# Patient Record
Sex: Female | Born: 1935 | Race: White | Hispanic: No | Marital: Married | Smoking: Never smoker
Health system: Southern US, Community
[De-identification: ages and names within clinical notes are randomized; demographics above are authoritative.]

## PROBLEM LIST (undated history)

## (undated) DIAGNOSIS — K5790 Diverticulosis of intestine, part unspecified, without perforation or abscess without bleeding: Secondary | ICD-10-CM

## (undated) DIAGNOSIS — K219 Gastro-esophageal reflux disease without esophagitis: Secondary | ICD-10-CM

## (undated) DIAGNOSIS — I639 Cerebral infarction, unspecified: Secondary | ICD-10-CM

## (undated) DIAGNOSIS — I6529 Occlusion and stenosis of unspecified carotid artery: Secondary | ICD-10-CM

## (undated) DIAGNOSIS — E785 Hyperlipidemia, unspecified: Secondary | ICD-10-CM

## (undated) HISTORY — DX: Cerebral infarction, unspecified: I63.9

## (undated) HISTORY — DX: Occlusion and stenosis of unspecified carotid artery: I65.29

## (undated) HISTORY — PX: TONSILLECTOMY: SUR1361

## (undated) HISTORY — DX: Gastro-esophageal reflux disease without esophagitis: K21.9

## (undated) HISTORY — DX: Hyperlipidemia, unspecified: E78.5

---

## 1998-02-13 ENCOUNTER — Other Ambulatory Visit: Admission: RE | Admit: 1998-02-13 | Discharge: 1998-02-13 | Payer: Self-pay | Admitting: Obstetrics and Gynecology

## 1999-05-19 ENCOUNTER — Other Ambulatory Visit: Admission: RE | Admit: 1999-05-19 | Discharge: 1999-05-19 | Payer: Self-pay | Admitting: Obstetrics and Gynecology

## 1999-09-06 ENCOUNTER — Encounter: Admission: RE | Admit: 1999-09-06 | Discharge: 1999-09-06 | Payer: Self-pay | Admitting: Obstetrics and Gynecology

## 1999-09-06 ENCOUNTER — Encounter: Payer: Self-pay | Admitting: Geriatric Medicine

## 2000-05-24 ENCOUNTER — Other Ambulatory Visit: Admission: RE | Admit: 2000-05-24 | Discharge: 2000-05-24 | Payer: Self-pay | Admitting: Geriatric Medicine

## 2000-10-11 ENCOUNTER — Encounter: Payer: Self-pay | Admitting: Obstetrics and Gynecology

## 2000-10-11 ENCOUNTER — Ambulatory Visit (HOSPITAL_COMMUNITY): Admission: RE | Admit: 2000-10-11 | Discharge: 2000-10-11 | Payer: Self-pay | Admitting: Obstetrics and Gynecology

## 2001-06-01 ENCOUNTER — Other Ambulatory Visit: Admission: RE | Admit: 2001-06-01 | Discharge: 2001-06-01 | Payer: Self-pay | Admitting: Obstetrics and Gynecology

## 2001-08-28 ENCOUNTER — Encounter: Payer: Self-pay | Admitting: Obstetrics and Gynecology

## 2001-08-28 ENCOUNTER — Encounter: Admission: RE | Admit: 2001-08-28 | Discharge: 2001-08-28 | Payer: Self-pay | Admitting: Obstetrics and Gynecology

## 2001-10-12 ENCOUNTER — Encounter: Payer: Self-pay | Admitting: Obstetrics and Gynecology

## 2001-10-12 ENCOUNTER — Encounter: Admission: RE | Admit: 2001-10-12 | Discharge: 2001-10-12 | Payer: Self-pay | Admitting: Obstetrics and Gynecology

## 2002-12-09 ENCOUNTER — Encounter: Admission: RE | Admit: 2002-12-09 | Discharge: 2002-12-09 | Payer: Self-pay | Admitting: Obstetrics and Gynecology

## 2002-12-09 ENCOUNTER — Encounter: Payer: Self-pay | Admitting: Obstetrics and Gynecology

## 2002-12-18 ENCOUNTER — Other Ambulatory Visit: Admission: RE | Admit: 2002-12-18 | Discharge: 2002-12-18 | Payer: Self-pay | Admitting: Gynecology

## 2002-12-22 ENCOUNTER — Emergency Department (HOSPITAL_COMMUNITY): Admission: EM | Admit: 2002-12-22 | Discharge: 2002-12-22 | Payer: Self-pay | Admitting: Emergency Medicine

## 2002-12-22 ENCOUNTER — Encounter: Payer: Self-pay | Admitting: Emergency Medicine

## 2003-12-18 ENCOUNTER — Encounter: Admission: RE | Admit: 2003-12-18 | Discharge: 2003-12-18 | Payer: Self-pay | Admitting: Obstetrics and Gynecology

## 2004-04-14 ENCOUNTER — Ambulatory Visit (HOSPITAL_COMMUNITY): Admission: RE | Admit: 2004-04-14 | Discharge: 2004-04-14 | Payer: Self-pay | Admitting: Geriatric Medicine

## 2004-04-20 HISTORY — PX: CAROTID ENDARTERECTOMY: SUR193

## 2004-05-18 ENCOUNTER — Encounter (INDEPENDENT_AMBULATORY_CARE_PROVIDER_SITE_OTHER): Payer: Self-pay | Admitting: Specialist

## 2004-05-18 ENCOUNTER — Ambulatory Visit (HOSPITAL_COMMUNITY): Admission: RE | Admit: 2004-05-18 | Discharge: 2004-05-19 | Payer: Self-pay | Admitting: Vascular Surgery

## 2004-12-24 ENCOUNTER — Encounter: Admission: RE | Admit: 2004-12-24 | Discharge: 2004-12-24 | Payer: Self-pay | Admitting: Geriatric Medicine

## 2005-04-15 ENCOUNTER — Other Ambulatory Visit: Admission: RE | Admit: 2005-04-15 | Discharge: 2005-04-15 | Payer: Self-pay | Admitting: Geriatric Medicine

## 2006-01-03 ENCOUNTER — Encounter: Admission: RE | Admit: 2006-01-03 | Discharge: 2006-01-03 | Payer: Self-pay | Admitting: Geriatric Medicine

## 2007-01-08 ENCOUNTER — Encounter: Admission: RE | Admit: 2007-01-08 | Discharge: 2007-01-08 | Payer: Self-pay | Admitting: Geriatric Medicine

## 2007-04-30 ENCOUNTER — Other Ambulatory Visit: Admission: RE | Admit: 2007-04-30 | Discharge: 2007-04-30 | Payer: Self-pay | Admitting: Urology

## 2007-06-18 ENCOUNTER — Ambulatory Visit: Payer: Self-pay | Admitting: Vascular Surgery

## 2007-11-30 ENCOUNTER — Ambulatory Visit: Payer: Self-pay | Admitting: Vascular Surgery

## 2008-01-28 ENCOUNTER — Encounter: Admission: RE | Admit: 2008-01-28 | Discharge: 2008-01-28 | Payer: Self-pay | Admitting: Geriatric Medicine

## 2008-06-03 ENCOUNTER — Ambulatory Visit: Payer: Self-pay | Admitting: Vascular Surgery

## 2008-12-17 ENCOUNTER — Ambulatory Visit: Payer: Self-pay | Admitting: Vascular Surgery

## 2009-02-03 ENCOUNTER — Encounter: Admission: RE | Admit: 2009-02-03 | Discharge: 2009-02-03 | Payer: Self-pay | Admitting: Geriatric Medicine

## 2009-05-06 ENCOUNTER — Other Ambulatory Visit: Admission: RE | Admit: 2009-05-06 | Discharge: 2009-05-06 | Payer: Self-pay | Admitting: Geriatric Medicine

## 2009-06-11 ENCOUNTER — Ambulatory Visit: Payer: Self-pay | Admitting: Surgery

## 2010-02-26 ENCOUNTER — Encounter: Admission: RE | Admit: 2010-02-26 | Discharge: 2010-02-26 | Payer: Self-pay | Admitting: Geriatric Medicine

## 2010-05-11 ENCOUNTER — Other Ambulatory Visit: Admission: RE | Admit: 2010-05-11 | Discharge: 2010-05-11 | Payer: Self-pay | Admitting: Geriatric Medicine

## 2010-06-01 ENCOUNTER — Ambulatory Visit: Payer: Self-pay | Admitting: Vascular Surgery

## 2010-11-02 NOTE — Procedures (Signed)
CAROTID DUPLEX EXAM   INDICATION:  Followup carotid artery disease.   HISTORY:  Diabetes:  No.  Cardiac:  No.  Hypertension:  No.  Smoking:  No.  Previous Surgery:  Left CEA with DPA, November, 2005 by Dr. Arbie Cookey.  CV History:  No.  Amaurosis Fugax No, Paresthesias No, Hemiparesis No                                       RIGHT             LEFT  Brachial systolic pressure:         160               158  Brachial Doppler waveforms:         Triphasic         Triphasic  Vertebral direction of flow:        Antegrade         Antegrade  DUPLEX VELOCITIES (cm/sec)  CCA peak systolic                   111               105  ECA peak systolic                   134               173  ICA peak systolic                   212               93  ICA end diastolic                   71                31  PLAQUE MORPHOLOGY:                  Calcified         N/A  PLAQUE AMOUNT:                      Moderate          N/A  PLAQUE LOCATION:                    ICA               N/A   IMPRESSION:  1. Right internal carotid artery shows evidence of 60-79% stenosis      (low end of range), which shows an increase in velocity; however,      no category change.  2. Left internal carotid artery, status post carotid endarterectomy,      shows no evidence of restenosis.  3. Left mild internal carotid artery stenosis.   ___________________________________________  Larina Earthly, M.D.   AS/MEDQ  D:  06/18/2007  T:  06/18/2007  Job:  [id number]

## 2010-11-02 NOTE — Assessment & Plan Note (Signed)
OFFICE VISIT   [name], [name]  DOB:  [birth year]                                       12/17/2008  DGUYQ#:[id number]   The patient presents today for followup of her __________vascular  occlusive disease.  She is known to me from a prior left carotid  endarterectomy in 2005.  She has been followed in our noninvasive  vascular laboratory since that time and wished to discuss her study  today.  She does continue to be completely asymptomatic with no history  of amaurosis fugax, transient ischemic attack or stroke.  She continues  to do well from a medical standpoint and is quite active.   MEDICATIONS:  She does take medication for elevated cholesterol but  otherwise is on no medications besides some supplements and daily  aspirin.   SOCIAL HISTORY:  She is married with two children.  Does not smoke or  drink alcohol.   REVIEW OF SYSTEMS:  Positive for probable esophageal reflux.   PHYSICAL EXAMINATION:  General:  A well-developed, well-nourished white  female appearing younger than stated age of 63.  Blood pressure is  145/72, pulse 82, respirations 18.  Her radial pulses are 2+.  She has  3+ posterior tibial pulses bilaterally.  She is grossly intact  neurologically.  She has normal carotid pulses bilaterally.   She underwent repeat carotid duplex today and this shows no change from  her prior studies.  She is in the 60-79% stenosis in the right carotid  system.  On the left side she has no stenosis and has a widely patent  endarterectomy on the left.  I discussed these findings with patient.  Recommended that we continue to see her in 6 month intervals with our  vascular lab due to her moderate to severe stenosis.  I again reviewed  symptoms of carotid disease with her and she will notify me should this  occur otherwise we will see her in 6 months.   Larina Earthly, M.D.  Electronically Signed   TFE/MEDQ  D:  12/17/2008  T:  12/18/2008  Job:   [id number]   cc:   Hal T. Stoneking, M.D.

## 2010-11-02 NOTE — Procedures (Signed)
CAROTID DUPLEX EXAM   INDICATION:  Carotid disease.   HISTORY:  Diabetes:  No.  Cardiac:  No.  Hypertension:  No.  Smoking:  No.  Previous Surgery:  Left carotid endarterectomy in November of 2005.  CV History:  Currently asymptomatic.  Amaurosis Fugax No, Paresthesias No, Hemiparesis No                                       RIGHT             LEFT  Brachial systolic pressure:         142               140  Brachial Doppler waveforms:         Normal            Normal  Vertebral direction of flow:        Antegrade         Antegrade  DUPLEX VELOCITIES (cm/sec)  CCA peak systolic                   114               101  ECA peak systolic                   128               145  ICA peak systolic                   221               91  ICA end diastolic                   62                20  PLAQUE MORPHOLOGY:                  Mixed  PLAQUE AMOUNT:                      Moderate/severe   None  PLAQUE LOCATION:                    ICA/ECA   IMPRESSION:  1. 60-79% stenosis of the right internal carotid artery.  2. Patent left carotid endarterectomy site with no evidence of      stenosis.  3. No significant change noted when compared to the previous exam on      06/03/2008.   ___________________________________________  Larina Earthly, M.D.   CH/MEDQ  D:  12/17/2008  T:  12/18/2008  Job:  [id number]

## 2010-11-02 NOTE — Procedures (Signed)
CAROTID DUPLEX EXAM   INDICATION:  Followup, left carotid endarterectomy.   HISTORY:  Diabetes:  No.  Cardiac:  No.  Hypertension:  No.  Smoking:  No.  Previous Surgery:  Left carotid endarterectomy in November, 2005.  CV History:  No.  Amaurosis Fugax No, Paresthesias No, Hemiparesis No                                       RIGHT               LEFT  Brachial systolic pressure:         158                 154  Brachial Doppler waveforms:         Normal              Normal  Vertebral direction of flow:        Antegrade           Antegrade  DUPLEX VELOCITIES (cm/sec)  CCA peak systolic                   90                  82  ECA peak systolic                   120                 105  ICA peak systolic                   212                 72  ICA end diastolic                   76                  23  PLAQUE MORPHOLOGY:                  Calcific            None  PLAQUE AMOUNT:                      Moderate            None  PLAQUE LOCATION:                    Bifurcation/ICA/ECA None   IMPRESSION:  1. Patent left carotid endarterectomy site with no evidence of      stenosis.  2. 60-79% stenosis of the right internal carotid artery.  3. No significant change noted from the previous examination on      06/18/07.   ___________________________________________  Larina Earthly, M.D.   CH/MEDQ  D:  11/30/2007  T:  11/30/2007  Job:  [id number]

## 2010-11-02 NOTE — Procedures (Signed)
CAROTID DUPLEX EXAM   INDICATION:  Follow up left carotid endarterectomy.   HISTORY:  Diabetes:  No.  Cardiac:  No.  Hypertension:  No.  Smoking:  No.  Previous Surgery:  Left carotid endarterectomy in November 2005.  CV History:  Currently asymptomatic.  Amaurosis Fugax No, Paresthesias No, Hemiparesis No.                                       RIGHT             LEFT  Brachial systolic pressure:         144               142  Brachial Doppler waveforms:         Normal            Normal  Vertebral direction of flow:        Antegrade         Antegrade  DUPLEX VELOCITIES (cm/sec)  CCA peak systolic                   92                94  ECA peak systolic                   148               120  ICA peak systolic                   215               83  ICA end diastolic                   54                20  PLAQUE MORPHOLOGY:                  Mixed  PLAQUE AMOUNT:                      Moderate          None  PLAQUE LOCATION:                    ICA/ECA   IMPRESSION:  1. Doppler velocities suggest a high-end 40% to 59% stenosis of the      right proximal internal carotid artery.  2. Patent left carotid endarterectomy with no left internal carotid      artery stenosis.  3. No significant change in Doppler velocities when compared to the      previous examination on 06/11/2009.   ___________________________________________  Larina Earthly, M.D.   CH/MEDQ  D:  06/01/2010  T:  06/01/2010  Job:  [id number]

## 2010-11-02 NOTE — Procedures (Signed)
CAROTID DUPLEX EXAM   INDICATION:  Carotid disease.   HISTORY:  Diabetes:  No.  Cardiac:  No.  Hypertension:  No.  Smoking:  No.  Previous Surgery:  Left carotid endarterectomy in 04/2004.  CV History:  Currently asymptomatic.  Amaurosis Fugax No, Paresthesias No, Hemiparesis No                                       RIGHT             LEFT  Brachial systolic pressure:         144               140  Brachial Doppler waveforms:         Normal            Normal  Vertebral direction of flow:        Antegrade         Antegrade  DUPLEX VELOCITIES (cm/sec)  CCA peak systolic                   70                89  ECA peak systolic                   128               113  ICA peak systolic                   230               70  ICA end diastolic                   56                21  PLAQUE MORPHOLOGY:                  Mixed  PLAQUE AMOUNT:                      Moderate / severe None  PLAQUE LOCATION:                    ICA / ECA   IMPRESSION:  1. 60%-79% stenosis of the right internal carotid artery.  2. Patent left carotid endarterectomy site with no left internal      carotid artery stenosis.  3. No significant change noted when compared to the previous exam on      12/17/2008.   ___________________________________________  Larina Earthly, M.D.   CH/MEDQ  D:  06/11/2009  T:  06/11/2009  Job:  [id number]

## 2010-11-02 NOTE — Procedures (Signed)
CAROTID DUPLEX EXAM   INDICATION:  Follow up left carotid endarterectomy.   HISTORY:  Diabetes:  No.  Cardiac:  No.  Hypertension:  No.  Smoking:  No.  Previous Surgery:  Left carotid endarterectomy in November, 2005.  CV History:  No.  Amaurosis Fugax No, Paresthesias No, Hemiparesis No.                                       RIGHT             LEFT  Brachial systolic pressure:         134               140  Brachial Doppler waveforms:         Within normal limits                Within normal limits  Vertebral direction of flow:        Antegrade         Antegrade  DUPLEX VELOCITIES (cm/sec)  CCA peak systolic                   102               99  ECA peak systolic                   226               142  ICA peak systolic                   222               95  ICA end diastolic                   54                30  PLAQUE MORPHOLOGY:                  Calcified  PLAQUE AMOUNT:                      Moderate  PLAQUE LOCATION:                    Bifurcation, ICA, ECA   IMPRESSION:  1. 60-79% stenosis of the right internal carotid artery.  2. Patent left internal carotid artery with no evidence of restenosis.   ___________________________________________  Larina Earthly, M.D.   AC/MEDQ  D:  06/04/2008  T:  06/04/2008  Job:  [id number]

## 2010-11-05 NOTE — Op Note (Signed)
[name], [name]            ACCOUNT NO.:  [id number]   MEDICAL RECORD NO.:  [id number]          PATIENT TYPE:  OIB   LOCATION:  2899                         FACILITY:  [facility]   PHYSICIAN:  Larina Earthly, M.D.    DATE OF BIRTH:  June 06, 1931   DATE OF PROCEDURE:  05/18/2004  DATE OF DISCHARGE:                                 OPERATIVE REPORT   PREOPERATIVE DIAGNOSIS:  Severe asymptomatic left internal carotid artery  stenosis.   POSTOPERATIVE DIAGNOSIS:  Severe asymptomatic left internal carotid artery  stenosis.   OPERATION PERFORMED:  Left carotid endarterectomy and Dacron patch  angioplasty.   SURGEON:  Larina Earthly, M.D.   ASSISTANT:  Rowe Clack, P.A.-C.   ANESTHESIA:  General endotracheal.   COMPLICATIONS:  None.   DISPOSITION:  Recovery room stable.   DESCRIPTION OF PROCEDURE:  The patient was taken to the operating room and  placed in supine position where the area of the left neck was prepped and  draped in the usual sterile fashion.  An incision was made in the anterior  sternocleidomastoid and carried down through the platysma with  electrocautery.  The sternocleidomastoid was reflected posteriorly and the  carotid sheath was opened.  The vagus and hypoglossal nerves were identified  and preserved.  The facial vein was ligated with 2-0 silk ties and divided.  The common carotid artery was encircled with an umbilical tape and Rumel  tourniquet.  The internal carotid artery was encircled with umbilical tape  and Rumel tourniquet and the external carotid artery was encircled with a  blue vessel loop.  The superior thyroid artery was encircled with a 2-0 silk  Potts tie. The patient was given 7000 units of intravenous heparin.  After  adequate circulation time, the internal, external and common carotid  arteries were occluded.  The common carotid artery was opened with an 11  blade and extended longitudinally with Potts scissors through the plaque  onto the  internal carotid artery.  A 10 shunt was passed up the internal  carotids and allowed to back-bleed, then down the common carotid where it  was secured with Rumel tourniquets.  The endarterectomy begun on the common  carotid artery and the plaque was divided proximally with Potts scissors.  Endarterectomy was extended onto the bifurcation.  The external carotid was  endarterectomized with an eversion technique and the internal carotid artery  was endarterectomized in an open fashion. Remaining atheromatous debris was  removed from the endarterectomy plane.  A Finesse Hemashield Dacron patch  was brought onto the field and sewn as a patch angioplasty with a running 6-  0 Prolene suture.  Prior to completion of the anastomosis, the shunt was  removed and the usual flushing maneuvers were undertaken.  The anastomosis  was then completed.  The external followed by the common and finally the  internal carotid artery occlusion clamp was removed.  Excellent __________  noted with hand held Doppler in the internal and external carotid arteries.  The patient was given 50 mg of protamine to reverse the heparin.  The wounds  were irrigated with saline,  hemostasis obtained with cautery.  The wounds  were closed with several 3-0 Vicryl sutures reapproximating the  sternocleidomastoid muscle over the carotid sheath.  Next, the platysma was  closed with a  running 3-0 Vicryl suture and finally the skin was closed with 4-0  subcuticular Vicryl stitch.  Sterile dressing was applied.  The patient  awakened, neurologically intact in the operating room and was transferred to  the recovery room in stable condition.      Todd   TFE/MEDQ  D:  05/18/2004  T:  05/18/2004  Job:  [id number]   cc:   Hal T. Stoneking, M.D.  [address] [place], [place] [address]  Fax: [phone]

## 2010-11-05 NOTE — H&P (Signed)
NAMEJUSTIN, [name], M.D.    DATE OF BIRTH:  [birth year]   DATE OF ADMISSION:  05/18/2004  DATE OF DISCHARGE:                                HISTORY & PHYSICAL   PRIMARY CARE PHYSICIAN:  Hal T. Stoneking, M.D.   CHIEF COMPLAINT:  Bilateral internal carotid artery stenosis.   HISTORY OF PRESENT ILLNESS:  This is a 77 year old Caucasian female referred  from Dr. Pete Glatter for evaluation of bilateral carotid artery stenoses.  The  patient was found on physical exam to have bilateral carotid bruits and was  therefore referred to Dr. Arbie Cookey for consideration of carotid endarterectomy.  The patient complains of pulsatile tinnitus for several months, particularly  on the left side.  She denies any history of TIAs, CVA, and amaurosis fugax.  She also denies heart disease and COPD. She does experience mild dyspnea on  exertion with aggressive physical activity. Carotid duplex was performed  which revealed  moderate to severe stenosis on the right and severe stenosis  with a greater than 80% stenosis on the left. Dr. Arbie Cookey felt that the  patient was a candidate for carotid endarterectomy and presents today for a  history and physical prior to surgery.   PAST MEDICAL/SURGICAL HISTORY:  1.  Extra cranial cerebrovascular occlusive disease, status post Doppler      study times two.  2.  Dyslipidemia.  3.  Seizure at age 15.  4.  History of easy bruising and hematoma formation after blood draws and      after her tonsillectomy as a child.  5.  Status post tonsillectomy.  6.  Status post root canal.   ALLERGIES:  The patient is intolerant to amoxicillin and ibuprofen which she  states caused a rash in the past.   MEDICATIONS:  1.  Fosamax 70 mg every week.  2.  Aspirin 81 mg daily.  3.  Multivitamin  daily.  4.  Vitamin C 500 mg daily.  5.  Vitamin D 400 IU daily.  6.  Tums two daily.   SOCIAL HISTORY:  This is a married female with two children. She is retired.  She denies tobacco use and does not drink alcohol. She lives with her  husband and continues to drive.   FAMILY HISTORY:  Mother with CVA and coronary artery disease. Father with  bladder cancer and heart disease.   REVIEW OF SYSTEMS:  Negative with the exception of eyeglasses, mild dyspnea  on exertion, and easy bruising. The patient specifically denies signs and  symptoms of TIA, CVA, cardiac disease, and respiratory problems.   PHYSICAL EXAMINATION:  VITAL SIGNS: Blood pressure 140/75, heart rate 76,  respirations 20, temperature 97.6, weight 110.4.  GENERAL: This is a 77 year old Caucasian female in no acute distress, she is  alert and oriented times three.  HEENT: Pupils equal, round, and reactive. Extraocular movements are intact.  Oral mucosa is pink and moist.  NECK: Full  range of motion. There are bilateral carotid bruits auscultated.  There is no thyromegaly or lymphadenopathy.  CHEST: Symmetrical. Breathing is unlabored. Breath sounds are clear. There  are no rales, rhonchi, or wheezes.  CARDIAC: Regular rate and rhythm with no murmurs, rubs, or gallops.  ABDOMEN: Flat. There are active bowel sounds in all four quadrants. It is  soft and nontender.  GENITOURINARY: Deferred.  VASCULAR: Pulses are 2+ bilaterally: Carotids, femorals, posterior tibials,  and pedal pulses.  EXTREMITIES: No edema, varicosities, venous stasis changes, skin breakdown,  or ulcerations. They are warm bilaterally. There is a fungal infection  present in the left index finger.  NEUROLOGIC: The patient is alert and oriented times three. Cranial nerves  are intact. The gait is steady.   LABORATORY AND RADIOLOGY EXAMINATIONS:  Pending at this time.   IMPRESSION:  Bilateral internal carotid artery stenosis.   PLAN:  Left carotid  endarterectomy on May 18, 2004, by Dr. Arbie Cookey. Dr.  Arbie Cookey has seen and evaluated this patient prior to this admission and has  explained the risks and benefits of the procedure and the patient has agreed  to continue.      Aman   AY/MEDQ  D:  05/12/2004  T:  05/12/2004  Job:  [id number]

## 2010-11-05 NOTE — Discharge Summary (Signed)
NAMEQUERIDA, BERETTA            ACCOUNT NO.:  [id number]   MEDICAL RECORD NO.:  [id number]          PATIENT TYPE:  OIB   LOCATION:  3310                         FACILITY:  [facility]   PHYSICIAN:  Larina Earthly, M.D.    DATE OF BIRTH:  01-12-1931   DATE OF ADMISSION:  05/18/2004  DATE OF DISCHARGE:  05/19/2004                                 DISCHARGE SUMMARY   HISTORY OF PRESENT ILLNESS:  This is a 77 year old female patient of Dr.  Laverle Hobby referred to Dr. Arbie Cookey for evaluation of bilateral carotid artery  stenosis found on physical examination with bilateral carotid bruits.  The  patient has a history of pulsatile tinnitus for the past several months.  She noticed the symptoms to be particularly increased on the left side.  She  has no history of TIA, CVA or amaurosis.  A carotid Duplex has verified the  findings of moderate to severe stenosis on the right with severe stenosis of  greater than 80% on the left.  Due to these findings, it was recommended by  Dr. Arbie Cookey that she proceed with surgical carotid endarterectomy.  She was  admitted this hospitalization for the procedure.   PAST MEDICAL HISTORY:  1.  Extracranial cerebrovascular occlusive disease as described above.  2.  Dyslipidemia.  3.  History of seizure at age 29.   PAST SURGICAL HISTORY:  1.  Tonsillectomy.  2.  Root canal.   ALLERGIES:  She has an intolerance to AMOXICILLIN and IBUPROFEN.   CURRENT MEDICATIONS:  1.  Fosamax 70 mg q. week.  2.  Aspirin 81 mg daily.  3.  Multivitamin one daily.  4.  Vitamin C 500 mg daily.  5.  Vitamin D 400 international units daily.  6.  Tums two daily.   Family history, social history, review of systems and physical examination  please see the history and physical done at the time of admission.   HOSPITAL COURSE:  The patient was admitted electively on May 18, 2004,  taken to the operating room where she underwent the following procedure,  left carotid  endarterectomy with Dacron patch angioplasty.  The patient  tolerated the procedure well and was taken to the post anesthesia care unit  in stable condition.   POSTOPERATIVE HOSPITAL COURSE:  The patient  has done well.  She has  remained neurologically intact with no new deficits.  Laboratory values  reveal a mild postoperative anemia with hemoglobin and hematocrit  postoperative day #1 of 10 and 30, respectively.  Electrolytes, BUN and  creatinine are all within normal limits.  Incision was healing well without  evidence of hematoma or infection.  She tolerated a routine advancement in  activity commensurate for level of postoperative convalescence and overall  was deemed to be quite acceptable for discharge on May 19, 2004.   DISCHARGE MEDICATIONS:  As preoperatively.  Additionally for pain, Tylox one  or two q.4-6h. p.r.n. as needed.   DISCHARGE INSTRUCTIONS:  The patient received written instructions in regard  to medications, activity, diet, wound care and follow-up.  Follow-up will  include Dr. Arbie Cookey on June 03, 2004.  FINAL DIAGNOSIS:  Severe, left greater than right, extracranial  cerebrovascular carotid occlusive disease.  Other diagnoses as previously  listed per the history.      Domenica Fail   WEG/MEDQ  D:  05/19/2004  T:  05/19/2004  Job:  [id number]   cc:   Hal T. Stoneking, M.D.  [address] [place], [place] [address]  Fax: [phone]

## 2011-03-21 ENCOUNTER — Other Ambulatory Visit: Payer: Self-pay | Admitting: Geriatric Medicine

## 2011-03-21 DIAGNOSIS — Z1231 Encounter for screening mammogram for malignant neoplasm of breast: Secondary | ICD-10-CM

## 2011-04-15 ENCOUNTER — Ambulatory Visit
Admission: RE | Admit: 2011-04-15 | Discharge: 2011-04-15 | Disposition: A | Discharge status: Home / Self Care | Payer: Medicare Other | Source: Ambulatory Visit | Attending: Geriatric Medicine | Admitting: Geriatric Medicine

## 2011-04-15 DIAGNOSIS — Z1231 Encounter for screening mammogram for malignant neoplasm of breast: Secondary | ICD-10-CM

## 2011-06-29 ENCOUNTER — Encounter: Payer: Self-pay | Admitting: Vascular Surgery

## 2011-07-01 ENCOUNTER — Ambulatory Visit (INDEPENDENT_AMBULATORY_CARE_PROVIDER_SITE_OTHER): Payer: Medicare Other | Admitting: *Deleted

## 2011-07-01 ENCOUNTER — Ambulatory Visit: Payer: Medicare Other

## 2011-07-01 DIAGNOSIS — I6529 Occlusion and stenosis of unspecified carotid artery: Secondary | ICD-10-CM

## 2011-07-01 DIAGNOSIS — Z48812 Encounter for surgical aftercare following surgery on the circulatory system: Secondary | ICD-10-CM

## 2011-07-04 NOTE — Procedures (Unsigned)
CAROTID DUPLEX EXAM  INDICATION:  Follow up carotid artery disease.  HISTORY: Diabetes:  No. Cardiac:  No. Hypertension:  No. Smoking:  No. Previous Surgery:  Left carotid endarterectomy, 04/2004. CV History: Amaurosis Fugax No, Paresthesias No, Hemiparesis No.                                      RIGHT             LEFT Brachial systolic pressure:         138               134 Brachial Doppler waveforms:         Triphasic         Triphasic Vertebral direction of flow:        Antegrade         Antegrade DUPLEX VELOCITIES (cm/sec) CCA peak systolic                   98                83 ECA peak systolic                   158               90 ICA peak systolic                   243               96 ICA end diastolic                   54                24 PLAQUE MORPHOLOGY:                  Calcified         Mixed PLAQUE AMOUNT:                      Moderate to severe                  Mild PLAQUE LOCATION:                    Bifurcation/ICA   Bifurcation/ICA  IMPRESSION: 1. 60% to 79% right internal carotid artery stenosis. 2. Patent left carotid endarterectomy site with no restenosis.   ___________________________________________ Larina Earthly, M.D.  SS/MEDQ  D:  07/01/2011  T:  07/02/2011  Job:  [id number]

## 2011-07-18 ENCOUNTER — Other Ambulatory Visit: Payer: Self-pay | Admitting: *Deleted

## 2011-07-18 DIAGNOSIS — Z48812 Encounter for surgical aftercare following surgery on the circulatory system: Secondary | ICD-10-CM

## 2011-07-18 DIAGNOSIS — I6529 Occlusion and stenosis of unspecified carotid artery: Secondary | ICD-10-CM

## 2011-07-20 ENCOUNTER — Encounter: Payer: Self-pay | Admitting: Vascular Surgery

## 2011-10-07 DIAGNOSIS — H269 Unspecified cataract: Secondary | ICD-10-CM | POA: Insufficient documentation

## 2011-10-07 DIAGNOSIS — S0500XA Injury of conjunctiva and corneal abrasion without foreign body, unspecified eye, initial encounter: Secondary | ICD-10-CM | POA: Insufficient documentation

## 2011-12-09 DIAGNOSIS — H52209 Unspecified astigmatism, unspecified eye: Secondary | ICD-10-CM | POA: Insufficient documentation

## 2011-12-15 DIAGNOSIS — Z8679 Personal history of other diseases of the circulatory system: Secondary | ICD-10-CM | POA: Insufficient documentation

## 2011-12-15 HISTORY — PX: EYE SURGERY: SHX253

## 2011-12-29 HISTORY — PX: EYE SURGERY: SHX253

## 2012-01-16 ENCOUNTER — Encounter: Payer: Self-pay | Admitting: Neurosurgery

## 2012-01-17 ENCOUNTER — Other Ambulatory Visit (INDEPENDENT_AMBULATORY_CARE_PROVIDER_SITE_OTHER): Payer: Medicare Other | Admitting: *Deleted

## 2012-01-17 ENCOUNTER — Encounter: Payer: Self-pay | Admitting: Neurosurgery

## 2012-01-17 ENCOUNTER — Ambulatory Visit (INDEPENDENT_AMBULATORY_CARE_PROVIDER_SITE_OTHER): Payer: Medicare Other | Admitting: Neurosurgery

## 2012-01-17 VITALS — BP 126/63 | HR 69 | Resp 16 | Ht 62.0 in | Wt 108.9 lb

## 2012-01-17 DIAGNOSIS — Z48812 Encounter for surgical aftercare following surgery on the circulatory system: Secondary | ICD-10-CM

## 2012-01-17 DIAGNOSIS — I6529 Occlusion and stenosis of unspecified carotid artery: Secondary | ICD-10-CM

## 2012-01-17 NOTE — Progress Notes (Signed)
[facility] [place] Carotid Office Note  CC: Six-month carotid surveillance  Referring Physician: Early  History of Present Illness: 78 year old female patient of Dr. Arbie Cookey status post left CEA in November 2005. The patient reports no signs or symptoms of CVA, TIA, amaurosis fugax or any neural deficit. Patient denies any new medical diagnoses or recent surgery.  Past Medical History  Diagnosis Date  . Hyperlipidemia   . GERD (gastroesophageal reflux disease)     ROS: [x]  Positive   [ ]  Denies    General: [ ]  Weight loss, [ ]  Fever, [ ]  chills Neurologic: [ ]  Dizziness, [ ]  Blackouts, [ ]  Seizure [ ]  Stroke, [ ]  "Mini stroke", [ ]  Slurred speech, [ ]  Temporary blindness; [ ]  weakness in arms or legs, [ ]  Hoarseness Cardiac: [ ]  Chest pain/pressure, [ ]  Shortness of breath at rest [ ]  Shortness of breath with exertion, [ ]  Atrial fibrillation or irregular heartbeat Vascular: [ ]  Pain in legs with walking, [ ]  Pain in legs at rest, [ ]  Pain in legs at night,  [ ]  Non-healing ulcer, [ ]  Blood clot in vein/DVT,   Pulmonary: [ ]  Home oxygen, [ ]  Productive cough, [ ]  Coughing up blood, [ ]  Asthma,  [ ]  Wheezing Musculoskeletal:  [ ]  Arthritis, [ ]  Low back pain, [ ]  Joint pain Hematologic: [ ]  Easy Bruising, [ ]  Anemia; [ ]  Hepatitis Gastrointestinal: [ ]  Blood in stool, [ ]  Gastroesophageal Reflux/heartburn, [ ]  Trouble swallowing Urinary: [ ]  chronic Kidney disease, [ ]  on HD - [ ]  MWF or [ ]  TTHS, [ ]  Burning with urination, [ ]  Difficulty urinating Skin: [ ]  Rashes, [ ]  Wounds Psychological: [ ]  Anxiety, [ ]  Depression   Social History History  Substance Use Topics  . Smoking status: Never Smoker   . Smokeless tobacco: Not on file  . Alcohol Use: No    Family History Family History  Problem Relation Age of Onset  . Stroke Mother 64    Allergies  Allergen Reactions  . Amoxicillin     Current Outpatient Prescriptions  Medication Sig Dispense  Refill  . aspirin 81 MG tablet Take 160 mg by mouth daily.      . Calcium-Vitamin D (CALTRATE 600 PLUS-VIT D PO) Take 2 tablets by mouth daily.      . fish oil-omega-3 fatty acids 1000 MG capsule Take 1 g by mouth daily.      . Multiple Vitamin (MULTIVITAMIN) tablet Take 1 tablet by mouth daily.      . simvastatin (ZOCOR) 5 MG tablet Take 5 mg by mouth at bedtime.        Physical Examination  Filed Vitals:   01/17/12 1504  BP: 126/63  Pulse: 69  Resp:     Body mass index is 19.92 kg/(m^2).  General:  WDWN in NAD Gait: Normal HEENT: WNL Eyes: Pupils equal Pulmonary: normal non-labored breathing , without Rales, rhonchi,  wheezing Cardiac: RRR, without  Murmurs, rubs or gallops; Abdomen: soft, NT, no masses Skin: no rashes, ulcers noted  Vascular Exam Pulses: 2+ radial pulses bilaterally Carotid bruits: Mild carotid bruit heard on the right, carotid pulse to auscultation on the left Extremities without ischemic changes, no Gangrene , no cellulitis; no open wounds;  Musculoskeletal: no muscle wasting or atrophy   Neurologic: A&O X 3; Appropriate Affect ; SENSATION: normal; MOTOR FUNCTION:  moving all extremities equally. Speech is fluent/normal  Non-Invasive Vascular Imaging CAROTID DUPLEX 01/17/2012  Right ICA 60 - 79 % stenosis Left ICA 0 - 19% stenosis   ASSESSMENT/PLAN: Asymptomatic patient with moderate stenosis on the right, the patient will followup in 6 months with repeat carotid duplex and be seen in my clinic. The patient's questions were encouraged and answered, she is in agreement with this plan. The patient knows the signs and symptoms of CVA and knows to report to the nearest emergency department should that occur  Lauree Chandler ANP   Clinic MD: Early

## 2012-01-24 NOTE — Procedures (Unsigned)
CAROTID DUPLEX EXAM  INDICATION:  Follow up carotid artery disease.  HISTORY: Diabetes:  No. Cardiac:  No. Hypertension:  No. Smoking:  No. Previous Surgery:  Left carotid endarterectomy 04/2004. CV History:  Asymptomatic. Amaurosis Fugax No, Paresthesias No, Hemiparesis No                                      RIGHT             LEFT Brachial systolic pressure:         120               124 Brachial Doppler waveforms:         Triphasic         Triphasic Vertebral direction of flow:        Antegrade         Antegrade DUPLEX VELOCITIES (cm/sec) CCA peak systolic                   94                118 ECA peak systolic                   169               106 ICA peak systolic                   259               94 ICA end diastolic                   105               29 PLAQUE MORPHOLOGY:                  Calcific          Mixed PLAQUE AMOUNT:                      Moderate to severe                  Mild PLAQUE LOCATION:                    Bifurcation, ICA, and ECA           Bifurcation, ICA, and CCA  IMPRESSION: 1. 60-79% right internal carotid artery stenosis. 2. Patent left carotid endarterectomy site with no evidence for re-     stenosis. 3. Vertebral artery flow antegrade bilaterally.  ___________________________________________ Larina Earthly, M.D.  SS/MEDQ  D:  01/17/2012  T:  01/17/2012  Job:  [id number]

## 2012-02-08 DIAGNOSIS — H01009 Unspecified blepharitis unspecified eye, unspecified eyelid: Secondary | ICD-10-CM | POA: Insufficient documentation

## 2012-04-30 ENCOUNTER — Other Ambulatory Visit: Payer: Self-pay | Admitting: Geriatric Medicine

## 2012-04-30 DIAGNOSIS — Z1231 Encounter for screening mammogram for malignant neoplasm of breast: Secondary | ICD-10-CM

## 2012-06-06 ENCOUNTER — Ambulatory Visit
Admission: RE | Admit: 2012-06-06 | Discharge: 2012-06-06 | Disposition: A | Discharge status: Home / Self Care | Payer: Medicare Other | Source: Ambulatory Visit | Attending: Geriatric Medicine | Admitting: Geriatric Medicine

## 2012-06-06 DIAGNOSIS — Z1231 Encounter for screening mammogram for malignant neoplasm of breast: Secondary | ICD-10-CM

## 2012-07-16 ENCOUNTER — Other Ambulatory Visit: Payer: Self-pay | Admitting: *Deleted

## 2012-07-16 DIAGNOSIS — I6529 Occlusion and stenosis of unspecified carotid artery: Secondary | ICD-10-CM

## 2012-07-16 DIAGNOSIS — Z48812 Encounter for surgical aftercare following surgery on the circulatory system: Secondary | ICD-10-CM

## 2012-07-19 ENCOUNTER — Ambulatory Visit: Payer: Medicare Other | Admitting: Neurosurgery

## 2012-07-19 ENCOUNTER — Other Ambulatory Visit: Payer: Medicare Other

## 2012-07-30 ENCOUNTER — Encounter: Payer: Self-pay | Admitting: Neurosurgery

## 2012-07-31 ENCOUNTER — Encounter: Payer: Self-pay | Admitting: Neurosurgery

## 2012-07-31 ENCOUNTER — Other Ambulatory Visit (INDEPENDENT_AMBULATORY_CARE_PROVIDER_SITE_OTHER): Payer: Medicare Other | Admitting: *Deleted

## 2012-07-31 ENCOUNTER — Ambulatory Visit (INDEPENDENT_AMBULATORY_CARE_PROVIDER_SITE_OTHER): Payer: Medicare Other | Admitting: Neurosurgery

## 2012-07-31 VITALS — BP 137/72 | HR 74 | Resp 16 | Ht 62.0 in | Wt 109.8 lb

## 2012-07-31 DIAGNOSIS — Z48812 Encounter for surgical aftercare following surgery on the circulatory system: Secondary | ICD-10-CM

## 2012-07-31 DIAGNOSIS — I6529 Occlusion and stenosis of unspecified carotid artery: Secondary | ICD-10-CM

## 2012-07-31 NOTE — Progress Notes (Signed)
[facility] [place] Carotid Office Note  CC: Carotid surveillance Referring Physician: Early  History of Present Illness: 79 year old female patient of Dr. Arbie Cookey status post left CEA in 2005. The patient denies any signs or symptoms of CVA, TIA, amaurosis fugax or any neural deficit. The patient denies any new medical diagnoses or recent surgery.  Past Medical History  Diagnosis Date  . Hyperlipidemia   . GERD (gastroesophageal reflux disease)   . Carotid artery occlusion     ROS: [x]  Positive   [ ]  Denies    General: [ ]  Weight loss, [ ]  Fever, [ ]  chills Neurologic: [ ]  Dizziness, [ ]  Blackouts, [ ]  Seizure [ ]  Stroke, [ ]  "Mini stroke", [ ]  Slurred speech, [ ]  Temporary blindness; [ ]  weakness in arms or legs, [ ]  Hoarseness Cardiac: [ ]  Chest pain/pressure, [ ]  Shortness of breath at rest [ ]  Shortness of breath with exertion, [ ]  Atrial fibrillation or irregular heartbeat Vascular: [ ]  Pain in legs with walking, [ ]  Pain in legs at rest, [ ]  Pain in legs at night,  [ ]  Non-healing ulcer, [ ]  Blood clot in vein/DVT,   Pulmonary: [ ]  Home oxygen, [ ]  Productive cough, [ ]  Coughing up blood, [ ]  Asthma,  [ ]  Wheezing Musculoskeletal:  [ ]  Arthritis, [ ]  Low back pain, [ ]  Joint pain Hematologic: [ ]  Easy Bruising, [ ]  Anemia; [ ]  Hepatitis Gastrointestinal: [ ]  Blood in stool, [ ]  Gastroesophageal Reflux/heartburn, [ ]  Trouble swallowing Urinary: [ ]  chronic Kidney disease, [ ]  on HD - [ ]  MWF or [ ]  TTHS, [ ]  Burning with urination, [ ]  Difficulty urinating Skin: [ ]  Rashes, [ ]  Wounds Psychological: [ ]  Anxiety, [ ]  Depression   Social History History  Substance Use Topics  . Smoking status: Never Smoker   . Smokeless tobacco: Never Used  . Alcohol Use: No    Family History Family History  Problem Relation Age of Onset  . Stroke Mother 73  . Hypertension Mother   . Heart disease Mother   . Cancer Father     Bladder   . Heart attack Father      Allergies  Allergen Reactions  . Amoxicillin Diarrhea    Current Outpatient Prescriptions  Medication Sig Dispense Refill  . aspirin 81 MG tablet Take 160 mg by mouth daily.      . Calcium-Vitamin D (CALTRATE 600 PLUS-VIT D PO) Take 2 tablets by mouth daily.      . fish oil-omega-3 fatty acids 1000 MG capsule Take 1 g by mouth daily.      . Multiple Vitamin (MULTIVITAMIN) tablet Take 1 tablet by mouth daily.      . simvastatin (ZOCOR) 5 MG tablet Take 5 mg by mouth at bedtime.       No current facility-administered medications for this visit.    Physical Examination  Filed Vitals:   07/31/12 1500  BP: 137/72  Pulse: 74  Resp:     Body mass index is 20.08 kg/(m^2).  General:  WDWN in NAD Gait: Normal HEENT: WNL Eyes: Pupils equal Pulmonary: normal non-labored breathing , without Rales, rhonchi,  wheezing Cardiac: RRR, without  Murmurs, rubs or gallops; Abdomen: soft, NT, no masses Skin: no rashes, ulcers noted  Vascular Exam Pulses: 3+ radial pulses bilaterally Carotid bruits: Carotid pulses to auscultation no bruits are heard Extremities without ischemic changes, no Gangrene , no cellulitis; no open wounds;  Musculoskeletal:  no muscle wasting or atrophy   Neurologic: A&O X 3; Appropriate Affect ; SENSATION: normal; MOTOR FUNCTION:  moving all extremities equally. Speech is fluent/normal  Non-Invasive Vascular Imaging CAROTID DUPLEX 07/31/2012  Right ICA 40 - 59 % stenosis Left ICA 0 - 19% stenosis   ASSESSMENT/PLAN: Asymptomatic patient with stable carotid duplex from previous exam. The patient will followup in one year with repeat carotid duplex. The patient's questions were encouraged and answered, she is in agreement with this plan.  Lauree Chandler ANP   Clinic MD: Early

## 2012-08-01 NOTE — Addendum Note (Signed)
Addended by: Sharee Pimple on: 08/01/2012 09:01 AM   Modules accepted: Orders

## 2012-10-15 DIAGNOSIS — H26499 Other secondary cataract, unspecified eye: Secondary | ICD-10-CM | POA: Insufficient documentation

## 2013-05-09 ENCOUNTER — Other Ambulatory Visit: Payer: Self-pay

## 2013-05-09 DIAGNOSIS — Z1231 Encounter for screening mammogram for malignant neoplasm of breast: Secondary | ICD-10-CM

## 2013-06-06 ENCOUNTER — Ambulatory Visit (INDEPENDENT_AMBULATORY_CARE_PROVIDER_SITE_OTHER): Payer: Medicare Other | Admitting: Podiatry

## 2013-06-06 ENCOUNTER — Encounter: Payer: Self-pay | Admitting: Podiatry

## 2013-06-06 ENCOUNTER — Ambulatory Visit (INDEPENDENT_AMBULATORY_CARE_PROVIDER_SITE_OTHER): Payer: Medicare Other

## 2013-06-06 VITALS — BP 156/73 | HR 81 | Resp 16 | Ht 62.0 in | Wt 110.0 lb

## 2013-06-06 DIAGNOSIS — M898X9 Other specified disorders of bone, unspecified site: Secondary | ICD-10-CM

## 2013-06-06 DIAGNOSIS — L84 Corns and callosities: Secondary | ICD-10-CM

## 2013-06-06 DIAGNOSIS — M216X9 Other acquired deformities of unspecified foot: Secondary | ICD-10-CM

## 2013-06-06 NOTE — Progress Notes (Signed)
Subjective:     Patient ID: [name], female   DOB: January 05, 1931, 79 y.o.   MRN: [id number]  HPI patient states that I did calluses on the bottom of both feet and I have very painful lesions on the inner side of my fifth toes both feet that make wearing shoe gear difficult. States it's been present for several year   Review of Systems  All other systems reviewed and are negative.       Objective:   Physical Exam  Nursing note and vitals reviewed. Constitutional: She is oriented to person, place, and time.  Cardiovascular: Intact distal pulses.   Musculoskeletal: Normal range of motion.  Neurological: She is oriented to person, place, and time.  Skin: Skin is dry.   neurovascular status intact with muscle strength adequate and no equinus noted. Keratotic lesion inside fifth digits both feet that are very painful and keratotic lesion sub-first metatarsal right over left     Assessment:     Plantarflexed metatarsals with exostotic lesions fifth toe both feet    Plan:     H&P reviewed with patient and debridement of lesions accomplished today with consideration for exostectomy fifth toe both feet with education rendered concerning condition and treatment options. Reappoint when symptomatic

## 2013-06-06 NOTE — Progress Notes (Signed)
   Subjective:    Patient ID: [name], female    DOB: 15-Jun-1931, 79 y.o.   MRN: [id number]  HPI Comments: "I've got a thin spot on the bottom that keeps getting thick skin and corns."  Pt c/o of callused places plantar forefoot right. Been like this for several years, just getting worse. She goes hiking, in a hiking club and wants to stay active. She uses cotton and padding around area when walking.   Also has corns medial side of 5th toes bilateral. Uses padding to cushion, but still get painful sometimes.     Review of Systems  Eyes:       Dry eyes  Musculoskeletal: Positive for back pain.  All other systems reviewed and are negative.       Objective:   Physical Exam        Assessment & Plan:

## 2013-06-07 ENCOUNTER — Ambulatory Visit: Payer: Medicare Other

## 2013-06-11 ENCOUNTER — Ambulatory Visit
Admission: RE | Admit: 2013-06-11 | Discharge: 2013-06-11 | Disposition: A | Discharge status: Home / Self Care | Payer: Medicare Other | Source: Ambulatory Visit

## 2013-06-11 DIAGNOSIS — Z1231 Encounter for screening mammogram for malignant neoplasm of breast: Secondary | ICD-10-CM

## 2013-08-06 ENCOUNTER — Ambulatory Visit: Payer: Medicare Other | Admitting: Family

## 2013-08-06 ENCOUNTER — Other Ambulatory Visit (HOSPITAL_COMMUNITY): Payer: Self-pay

## 2013-08-12 ENCOUNTER — Encounter: Payer: Self-pay | Admitting: Vascular Surgery

## 2013-08-13 ENCOUNTER — Other Ambulatory Visit (HOSPITAL_COMMUNITY): Payer: Self-pay

## 2013-08-13 ENCOUNTER — Ambulatory Visit: Payer: Self-pay | Admitting: Family

## 2013-09-19 ENCOUNTER — Encounter: Payer: Self-pay | Admitting: Family

## 2013-09-23 ENCOUNTER — Ambulatory Visit (INDEPENDENT_AMBULATORY_CARE_PROVIDER_SITE_OTHER): Payer: Medicare Other | Admitting: Family

## 2013-09-23 ENCOUNTER — Encounter: Payer: Self-pay | Admitting: Family

## 2013-09-23 ENCOUNTER — Ambulatory Visit (HOSPITAL_COMMUNITY)
Admission: RE | Admit: 2013-09-23 | Discharge: 2013-09-23 | Disposition: A | Discharge status: Home / Self Care | Payer: Medicare Other | Source: Ambulatory Visit | Attending: Family | Admitting: Family

## 2013-09-23 VITALS — BP 132/64 | HR 69 | Resp 16 | Ht 62.0 in | Wt 112.0 lb

## 2013-09-23 DIAGNOSIS — I6529 Occlusion and stenosis of unspecified carotid artery: Secondary | ICD-10-CM | POA: Insufficient documentation

## 2013-09-23 DIAGNOSIS — Z48812 Encounter for surgical aftercare following surgery on the circulatory system: Secondary | ICD-10-CM | POA: Insufficient documentation

## 2013-09-23 NOTE — Patient Instructions (Signed)

## 2013-09-23 NOTE — Addendum Note (Signed)
Addended by: Sharee PimpleMCCHESNEY, Kassia Demarinis K on: 09/23/2013 05:03 PM   Modules accepted: Orders

## 2013-09-23 NOTE — Progress Notes (Signed)
Established Carotid Patient   History of Present Illness  [name] is a 80 y.o. female patient of Dr. Arbie Cookey status post left CEA in 2005 who presents today for follow up.  Patient has Negative history of TIA or stroke symptom.  The patient denies amaurosis fugax or monocular blindness.  The patient  denies facial drooping.  Pt. denies hemiplegia.  The patient denies receptive or expressive aphasia.  Pt. denies extremity weakness. She denies claudication symptoms with walking.  Patient denies New Medical or Surgical History.  Pt Diabetic: No Pt smoker: non-smoker She exercises 5 days/week.  Pt meds include: Statin : Yes ASA: Yes Other anticoagulants/antiplatelets: no   Past Medical History  Diagnosis Date  . Hyperlipidemia   . GERD (gastroesophageal reflux disease)   . Carotid artery occlusion     Social History History  Substance Use Topics  . Smoking status: Never Smoker   . Smokeless tobacco: Never Used  . Alcohol Use: No    Family History Family History  Problem Relation Age of Onset  . Stroke Mother 35  . Hypertension Mother   . Heart disease Mother   . Cancer Father     Bladder   . Heart attack Father     Surgical History Past Surgical History  Procedure Laterality Date  . Eye surgery  June 27,2013    Right eye- Cataract  . Eye surgery  July 11,2013    Left eye - Cataract  . Carotid endarterectomy Left Nov. 2005    Left carotid    Allergies  Allergen Reactions  . Amoxicillin Diarrhea    Current Outpatient Prescriptions  Medication Sig Dispense Refill  . aspirin 81 MG tablet Take 160 mg by mouth daily.      . Calcium-Vitamin D (CALTRATE 600 PLUS-VIT D PO) Take 2 tablets by mouth daily.      . fish oil-omega-3 fatty acids 1000 MG capsule Take 1 g by mouth daily.      . Multiple Vitamin (MULTIVITAMIN) tablet Take 1 tablet by mouth daily.      . simvastatin (ZOCOR) 5 MG tablet Take 5 mg by mouth at bedtime.       No current  facility-administered medications for this visit.    Review of Systems : See HPI for pertinent positives and negatives.  Physical Examination  Filed Vitals:   09/23/13 1009  BP: 132/64  Pulse: 69  Resp: 16   Filed Weights   09/23/13 1009  Weight: 112 lb (50.803 kg)   Body mass index is 20.48 kg/(m^2).  General: WDWN female in NAD GAIT: normal Eyes: PERRLA Pulmonary:  Non-labored, CTAB, Negative  Rales, Negative rhonchi, & Negative wheezing.  Cardiac: regular Rhythm ,  Negative detected murmur.  VASCULAR EXAM Carotid Bruits Left Right   Negative Negative    Radial pulses are palpable and equal.  LE Pulses LEFT RIGHT       POPLITEAL  not palpable   not palpable       POSTERIOR TIBIAL   palpable    palpable        DORSALIS PEDIS      ANTERIOR TIBIAL not palpable   palpable     Gastrointestinal: soft, nontender, BS WNL, no r/g,  negative masses.  Musculoskeletal: Negative muscle atrophy/wasting. M/S 5/5 throughout, Extremities without ischemic changes.  Neurologic: A&O X 3; Appropriate Affect ; SENSATION ;normal;  Speech is normal CN 2-12 intact, Pain and light touch intact in extremities, Motor exam as listed above.   Non-Invasive Vascular Imaging CAROTID DUPLEX 09/23/2013   CEREBROVASCULAR DUPLEX EVALUATION    INDICATION: Carotid endarterectomy     PREVIOUS INTERVENTION(S): Left carotid endarterectomy in 2005    DUPLEX EXAM:     RIGHT  LEFT  Peak Systolic Velocities (cm/s) End Diastolic Velocities (cm/s) Plaque LOCATION Peak Systolic Velocities (cm/s) End Diastolic Velocities (cm/s) Plaque  101 16  CCA PROXIMAL 97 18   84 17  CCA MID 82 16   69 16  CCA DISTAL 83 12   210 17 HT ECA 136 12   215 56 CP ICA PROXIMAL 73 15   120 28  ICA MID 89 19   63 16  ICA DISTAL 54 15     3.1 ICA / CCA Ratio (PSV) Not Calculated  Antegrade  Vertebral Flow Antegrade   Brachial Systolic Pressure (mmHg)   Multiphasic (subclavian artery) Brachial Artery Waveforms Multiphasic (subclavian artery)    Plaque Morphology:  HM = Homogeneous, HT = Heterogeneous, CP = Calcific Plaque, SP = Smooth Plaque, IP = Irregular Plaque     ADDITIONAL FINDINGS:   No significant stenosis of the left external or bilateral common carotid arteries.   Right external carotid artery stenosis noted.    IMPRESSION: 1. Patent left carotid endarterectomy site with no left internal carotid artery stenosis. 2. Doppler velocities suggest a 40-59% stenosis of the right proximal internal carotid artery.    Compared to the previous exam:  No significant change noted when compared to the previous exam on 07/31/12.     Assessment: [name] is a 80 y.o. female who presents with asymptomatic 40 - 59 % Right ICA  Stenosis and no left ICA stenosis which is the CEA site. The  ICA stenosis is  Unchanged from previous exam.  Plan: Follow-up in 1 year with Carotid Duplex scan.   I discussed in depth with the patient the nature of atherosclerosis, and emphasized the importance of maximal medical management including strict control of blood pressure, blood glucose, and lipid levels, obtaining regular exercise, and continued cessation of smoking.  The patient is aware that without maximal medical management the underlying atherosclerotic disease process will progress, limiting the benefit of any interventions. The patient was given information about stroke prevention and what symptoms should prompt the patient to seek immediate medical care. Thank you for allowing us to participate in this patient's care.  Charisse MarchSuzanne Leeam Cedrone, RN, MSN, FNP-C [facility] [place] Office: [phone]  Clinic Physician: Myra GianottiBrabham  09/23/2013 9:20 AM

## 2013-12-11 ENCOUNTER — Other Ambulatory Visit: Payer: Self-pay | Admitting: Dermatology

## 2014-05-13 ENCOUNTER — Other Ambulatory Visit: Payer: Self-pay

## 2014-05-13 DIAGNOSIS — Z1231 Encounter for screening mammogram for malignant neoplasm of breast: Secondary | ICD-10-CM

## 2014-06-16 ENCOUNTER — Ambulatory Visit
Admission: RE | Admit: 2014-06-16 | Discharge: 2014-06-16 | Disposition: A | Discharge status: Home / Self Care | Payer: Medicare Other | Source: Ambulatory Visit

## 2014-06-16 DIAGNOSIS — Z1231 Encounter for screening mammogram for malignant neoplasm of breast: Secondary | ICD-10-CM

## 2014-09-25 ENCOUNTER — Telehealth: Payer: Self-pay | Admitting: Cardiology

## 2014-09-30 ENCOUNTER — Other Ambulatory Visit (HOSPITAL_COMMUNITY): Payer: Medicare Other

## 2014-09-30 ENCOUNTER — Ambulatory Visit: Payer: Medicare Other | Admitting: Family

## 2014-10-07 ENCOUNTER — Ambulatory Visit (INDEPENDENT_AMBULATORY_CARE_PROVIDER_SITE_OTHER): Payer: Medicare Other | Admitting: Cardiology

## 2014-10-07 ENCOUNTER — Encounter: Payer: Self-pay | Admitting: Cardiology

## 2014-10-07 VITALS — BP 150/78 | HR 80 | Ht 62.0 in | Wt 110.4 lb

## 2014-10-07 DIAGNOSIS — I739 Peripheral vascular disease, unspecified: Secondary | ICD-10-CM

## 2014-10-07 DIAGNOSIS — I779 Disorder of arteries and arterioles, unspecified: Secondary | ICD-10-CM

## 2014-10-07 DIAGNOSIS — R0789 Other chest pain: Secondary | ICD-10-CM

## 2014-10-07 NOTE — Progress Notes (Signed)
Cardiology Office Note   Date:  10/07/2014   ID:  FALYN RUBEL, DOB [birth year], MRN [id number]  PCP:  Ginette Otto, MD  Cardiologist:   Donato Schultz, MD       History of Present Illness: [name] is a 81 y.o. female who presents for evaluation of chest pain. Has a history of hyperlipidemia, carotid artery disease followed by Dr. Arbie Cookey, hyperlipidemia. Has a family history of heart disease with her sister who has had coronary artery stenting. She had herself left carotid endarterectomy in 2005 by Dr. Arbie Cookey.   Several years ago, CP, drank water with walking and had chest pain. On March 12, doing home perm. Arm up for a while. Felt pressure in chest and could not get deep breath.   On March 16, went on hike with hiking club at [employer] and did not have a problem.   On September 07, 2014 - episode occurred in late March after rushing around and going out of the door to walk and was conscious of chest pressure. She took a nitroglycerin which seemed to help. She has been under significant amount of family stress with her son and grandson in legal difficulties. She has peripheral vascular disease as stated above.  For now has quit hiking.   Her grandmother took NTG. She of 7 girls.   Sister with CAD. Other with carotids.   [place] She is eager to go to trip to [facility]. Her husband has atrial fibrillation and is taking care of by Dr. Mayford Knife.   Past Medical History  Diagnosis Date  . Hyperlipidemia   . GERD (gastroesophageal reflux disease)   . Carotid artery occlusion     Past Surgical History  Procedure Laterality Date  . Eye surgery  June 27,2013    Right eye- Cataract  . Eye surgery  July 11,2013    Left eye - Cataract  . Carotid endarterectomy Left Nov. 2005    Left carotid  . Tonsillectomy       Current Outpatient Prescriptions  Medication Sig Dispense Refill  . aspirin 81 MG tablet Take 81 mg by mouth daily.       . Calcium-Vitamin D (CALTRATE 600 PLUS-VIT D PO) Take 2 tablets by mouth daily.    . fish oil-omega-3 fatty acids 1000 MG capsule Take 1,000 mg by mouth 2 (two) times daily.     . Multiple Vitamin (MULTIVITAMIN) tablet Take 1 tablet by mouth daily.    . nitroGLYCERIN (NITROSTAT) 0.4 MG SL tablet Place 0.4 mg under the tongue every 5 (five) minutes as needed for chest pain (for chest pain).    . simvastatin (ZOCOR) 5 MG tablet Take 5 mg by mouth at bedtime.     No current facility-administered medications for this visit.    Allergies:   Amoxicillin    Social History:  The patient  reports that she has never smoked. She has never used smokeless tobacco. She reports that she does not drink alcohol or use illicit drugs.   Family History:  The patient's family history includes Cancer in her father; Heart attack in her father and mother; Heart disease in her father and mother; Hypertension in her mother and sister; Stroke (age of onset: 38) in her mother.    ROS:  Please see the history of present illness.   Otherwise, review of systems are positive for dyspnea.   All other systems are reviewed and negative. Rushing in right  ear when laying on pillow.    PHYSICAL EXAM: VS:  BP 150/78 mmHg  Pulse 80  Ht 5\' 2"  (1.575 m)  Wt 110 lb 6.4 oz (50.077 kg)  BMI 20.19 kg/m2 , BMI Body mass index is 20.19 kg/(m^2). GEN: Well nourished, well developed, in no acute distress HEENT: normal Neck: no JVD, right sided carotid bruit, or masses, Left CEA Cardiac: RRR; no murmurs, rubs, or gallops,no edema  Respiratory:  clear to auscultation bilaterally, normal work of breathing GI: soft, nontender, nondistended, + BS MS: no deformity or atrophy Skin: warm and dry, no rash Neuro:  Strength and sensation are intact Psych: euthymic mood, full affect   EKG:  EKG is ordered today. The ekg ordered today demonstrates 10/07/14-sinus rhythm, heart rate 80 with poor R wave progression, no other  abnormalities.   Recent Labs: No results found for requested labs within last 365 days.    Lipid Panel No results found for: CHOL, TRIG, HDL, CHOLHDL, VLDL, LDLCALC, LDLDIRECT    Wt Readings from Last 3 Encounters:  10/07/14 110 lb 6.4 oz (50.077 kg)  09/23/13 112 lb (50.803 kg)  06/06/13 110 lb (49.896 kg)      Other studies Reviewed: Additional studies/ records that were reviewed today include: Review of records, office notes, labs. Review of the above records demonstrates: As above   ASSESSMENT AND PLAN:  1.  Chest pain-with her peripheral vascular disease, carotid artery disease, hypertension, hyperlipidemia, advanced age and symptoms that are concerning for possible angina, we will proceed with nuclear stress test for further evaluation. She enjoys hiking so she may be able to walk the treadmill for the nuclear stress. Other possibilities for chest pain include musculoskeletal (arms were up in the air for quite some time during her home perm). Nitroglycerin did help with her symptoms at one point. One of her sisters does have coronary artery disease.  2. Essential hypertension-mildly elevated today. She should meal list of her blood pressure medications, looked reassuring at home. Medications reviewed.  3. Hyperlipidemia-continue statin therapy. LDL goal less than 70.  4. Carotid artery disease-Dr. Arbie CookeyEarly. 2006. Right carotid bruit appreciated. He is monitoring her carotids closely.   Current medicines are reviewed at length with the patient today.  The patient does not have concerns regarding medicines.  The following changes have been made:  no change  Labs/ tests ordered today include:   Orders Placed This Encounter  Procedures  . Myocardial Perfusion Imaging     Disposition:   I will follow-up with results of testing. Mathews RobinsonsSigned, Xinyi Batton, MD  10/07/2014 4:46 PM    [facility] [address], [place], [place]  [address] Phone: [phone]  [phone]; Fax: [phone]

## 2014-10-07 NOTE — Patient Instructions (Signed)
Medication Instructions:  The current medical regimen is effective;  continue present plan and medications.  Labwork: none  Testing/Procedures: Your physician has requested that you have a myoview. For further information please visit [email or web address]. Please follow instruction sheet, as given.  Follow-Up: Follow up will be based on these results.

## 2014-10-09 ENCOUNTER — Telehealth: Payer: Self-pay | Admitting: Cardiology

## 2014-10-09 NOTE — Telephone Encounter (Signed)
Patient provided information on both iodine-based/gadolineum-based dye allergies. Discussed prevalence, risk factors, s/sx, and treatment. Patient also advised that unless she is on fluid restriction for medical reasons, it is a good idea to be well hydrated to assist with elimination of the dye out of the system/flushing out of kidneys. Patient verbalized understanding and appreciation for education.

## 2014-10-09 NOTE — Telephone Encounter (Signed)
New Message  Pt called states that since she has scheduled the test she has had a friend who died from a Heart problem found that she was allergic to the dye and the pt wants to know how often does that happen and how will she know if she is allergic. Please call back to discuss

## 2014-10-15 ENCOUNTER — Ambulatory Visit (HOSPITAL_COMMUNITY): Payer: Medicare Other | Attending: Cardiology | Admitting: Radiology

## 2014-10-15 DIAGNOSIS — R0789 Other chest pain: Secondary | ICD-10-CM

## 2014-10-15 DIAGNOSIS — I10 Essential (primary) hypertension: Secondary | ICD-10-CM | POA: Diagnosis not present

## 2014-10-15 DIAGNOSIS — R079 Chest pain, unspecified: Secondary | ICD-10-CM | POA: Insufficient documentation

## 2014-10-15 MED ORDER — TECHNETIUM TC 99M SESTAMIBI GENERIC - CARDIOLITE
11.0000 | Freq: Once | INTRAVENOUS | Status: AC | PRN
  Administered 2014-10-15: 11 via INTRAVENOUS

## 2014-10-15 MED ORDER — TECHNETIUM TC 99M SESTAMIBI GENERIC - CARDIOLITE
30.0000 | Freq: Once | INTRAVENOUS | Status: AC | PRN
  Administered 2014-10-15: 30 via INTRAVENOUS

## 2014-10-15 NOTE — Progress Notes (Signed)
[facility] SITE 3 NUCLEAR MED [address] [place], [place] [address] [phone]    Cardiology Nuclear Med Study  Susa LofflerShirley H Hudson is a 81 y.o. female     MRN : [id number]     DOB: Jan 28, 1931  Procedure Date: 10/15/2014  Nuclear Med Background Indication for Stress Test:  Evaluation for Ischemia History:  No known CAD Cardiac Risk Factors: Carotid Disease and Hypertension  Symptoms:  Chest Pain   Nuclear Pre-Procedure Caffeine/Decaff Intake:  None NPO After: 7:00pm   Lungs:  clear O2 Sat: 98% on room air. IV 0.9% NS with Angio Cath:  22g  IV Site: R Hand  IV Started by:  Bonnita LevanJackie Nannette Zill, RN  Chest Size (in):  34 Cup Size: B  Height: 5\' 2"  (1.575 m)  Weight:  109 lb (49.442 kg)  BMI:  Body mass index is 19.93 kg/(m^2). Tech Comments:  N/A    Nuclear Med Study 1 or 2 day study: 1 day  Stress Test Type:  Stress  Reading MD: N/A  Order Authorizing Provider:  Donato SchultzMark Skains, MD  Resting Radionuclide: Technetium 2161m Sestamibi  Resting Radionuclide Dose: 11.0 mCi   Stress Radionuclide:  Technetium 5661m Sestamibi  Stress Radionuclide Dose: 33.0 mCi           Stress Protocol Rest HR: 67 Stress HR: 137  Rest BP: 189/74 Stress BP: 217/86  Exercise Time (min): 7:00 METS: 8.5   Predicted Max HR: 139 bpm % Max HR: 98.56 bpm Rate Pressure Product: [id number]   Dose of Adenosine (mg):  n/a Dose of Lexiscan: n/a mg  Dose of Atropine (mg): n/a Dose of Dobutamine: n/a mcg/kg/min (at max HR)  Stress Test Technologist: Nelson ChimesSharon Brooks, BS-ES  Nuclear Technologist:  Kerby NoraElzbieta Kubak, CNMT     Rest Procedure:  Myocardial perfusion imaging was performed at rest 45 minutes following the intravenous administration of Technetium 7061m Sestamibi. Rest ECG: Normal sinus rhythm. Decreased anterior R-wave progression.  Stress Procedure:  The patient exercised on the treadmill utilizing the Bruce Protocol for 7:00 minutes. The patient stopped due to increased BP and denied any chest pain.   Technetium 5461m Sestamibi was injected at peak exercise and myocardial perfusion imaging was performed after a brief delay. Stress ECG: No significant EKG changes with stress.  QPS Raw Data Images:  Normal; no motion artifact; normal heart/lung ratio. Stress Images:  Normal homogeneous uptake in all areas of the myocardium. Rest Images:  Normal homogeneous uptake in all areas of the myocardium. Subtraction (SDS):  No evidence of ischemia. Transient Ischemic Dilatation (Normal <1.22):  0.90 Lung/Heart Ratio (Normal <0.45):  0.24  Quantitative Gated Spect Images QGS EDV:  58 ml QGS ESV:  13 ml  Impression Exercise Capacity:  Fair exercise capacity. BP Response:  Normal blood pressure response. Clinical Symptoms:  No significant symptoms noted. ECG Impression:  No significant ST segment change suggestive of ischemia. Comparison with Prior Nuclear Study: No images to compare  Overall Impression:  Normal stress nuclear study.   There is no scar or ischemia. This is a low risk scan. LV Ejection Fraction: 77%.  LV Wall Motion:  Normal Wall Motion   Willa RoughJeffrey Katz, MD

## 2014-10-28 ENCOUNTER — Encounter: Payer: Self-pay | Admitting: Family

## 2014-10-29 ENCOUNTER — Encounter: Payer: Self-pay | Admitting: Family

## 2014-10-29 ENCOUNTER — Ambulatory Visit (HOSPITAL_COMMUNITY)
Admission: RE | Admit: 2014-10-29 | Discharge: 2014-10-29 | Disposition: A | Discharge status: Home / Self Care | Payer: Medicare Other | Source: Ambulatory Visit | Attending: Family | Admitting: Family

## 2014-10-29 ENCOUNTER — Ambulatory Visit (INDEPENDENT_AMBULATORY_CARE_PROVIDER_SITE_OTHER): Payer: Medicare Other | Admitting: Family

## 2014-10-29 VITALS — BP 142/62 | HR 65 | Resp 16 | Ht 62.0 in | Wt 111.0 lb

## 2014-10-29 DIAGNOSIS — Z48812 Encounter for surgical aftercare following surgery on the circulatory system: Secondary | ICD-10-CM | POA: Diagnosis not present

## 2014-10-29 DIAGNOSIS — I6523 Occlusion and stenosis of bilateral carotid arteries: Secondary | ICD-10-CM | POA: Diagnosis not present

## 2014-10-29 DIAGNOSIS — Z9889 Other specified postprocedural states: Secondary | ICD-10-CM | POA: Diagnosis not present

## 2014-10-29 DIAGNOSIS — I6521 Occlusion and stenosis of right carotid artery: Secondary | ICD-10-CM | POA: Diagnosis not present

## 2014-10-29 LAB — VAS US CAROTID
LCCAPDIAS: 114 cm/s
LEFT BRACHIAL SYS: 168 cm/s
LEFT CCA MID DIAS: 22 R/I
LEFT CCA MID SYS: 108 cm/s
LEFT ECA DIAS: 13 cm/s
LEFT VERTEBRAL DIAS: 12 cm/s
LICADSYS: 103 cm/s
LICAMSYS: 97 cm/s
LICAPDIAS: 14 cm/s
LICAPSYS: 80 cm/s
Left CCA dist dias: 18 cm/s
Left CCA dist sys: 81 cm/s
Left CCA prox sys: 122 cm/s
Left ECA sys: 124 cm/s
Left ICA dist dias: 27 cm/s
Left ICA mid dias: 27 cm/s
Left vertebral sys: 56 cm/s
RCCADSYS: 83 cm/s
RCCAPDIAS: 24 cm/s
RECASYS: 195 cm/s
RICAMSYS: 251 cm/s
RIGHT CCA MID DIAS: 19 cm/s
RIGHT CCA MID SYS: 72 cm/s
RIGHT ECA DIAS: 16 cm/s
RIGHT VERTEBRAL DIAS: 13 cm/s
Right Brachial systolic velocity: 178 cm/s
Right CCA dist dias: 19 cm/s
Right CCA prox sys: 101 cm/s
Right ICA dist dias: 32 cm/s
Right ICA dist sys: 122 cm/s
Right ICA mid dias: 47 cm/s
Right ICA prox dias: 77 cm/s
Right ICA prox sys: 294 cm/s
Right vertebral sys: 55 cm/s

## 2014-10-29 NOTE — Progress Notes (Signed)
Established Carotid Patient   History of Present Illness  Sandy LofflerShirley H Borelli is a 81 y.o. female patient of Dr. Arbie CookeyEarly who is status post left CEA in 2005 who presents today for follow up.  Patient has Negative history of TIA or stroke symptom. The patient denies amaurosis fugax or monocular blindness. The patient denies facial drooping. Pt. denies hemiplegia. The patient denies receptive or expressive aphasia. Pt. denies extremity weakness. She denies claudication symptoms with walking.  Patient reports New Medical or Surgical History: cardiac evaluation for chest pressure, stress test April 2016 was normal per pt.  Pt Diabetic: No Pt smoker: non-smoker She exercises 5 days/week.  Pt meds include: Statin : Yes ASA: Yes Other anticoagulants/antiplatelets: no  Past Medical History  Diagnosis Date  . Hyperlipidemia   . GERD (gastroesophageal reflux disease)   . Carotid artery occlusion     Social History History  Substance Use Topics  . Smoking status: Never Smoker   . Smokeless tobacco: Never Used  . Alcohol Use: No    Family History Family History  Problem Relation Age of Onset  . Stroke Mother 3560    Multiple  . Hypertension Mother   . Heart disease Mother   . Heart attack Mother   . Cancer Father     Bladder   . Heart attack Father   . Heart disease Father   . Hypertension Sister   . Heart disease Sister     After age 79-  Carotid Artery Disease    Surgical History Past Surgical History  Procedure Laterality Date  . Eye surgery  June 27,2013    Right eye- Cataract  . Eye surgery  July 11,2013    Left eye - Cataract  . Carotid endarterectomy Left Nov. 2005    Left carotid  . Tonsillectomy      Allergies  Allergen Reactions  . Amoxicillin Diarrhea    Current Outpatient Prescriptions  Medication Sig Dispense Refill  . aspirin 81 MG tablet Take 81 mg by mouth daily.     . Calcium-Vitamin D (CALTRATE 600 PLUS-VIT D PO) Take 2 tablets by mouth  daily.    . fish oil-omega-3 fatty acids 1000 MG capsule Take 1,000 mg by mouth 2 (two) times daily.     . Multiple Vitamin (MULTIVITAMIN) tablet Take 1 tablet by mouth daily.    . nitroGLYCERIN (NITROSTAT) 0.4 MG SL tablet Place 0.4 mg under the tongue every 5 (five) minutes as needed for chest pain (for chest pain).    . simvastatin (ZOCOR) 5 MG tablet Take 5 mg by mouth at bedtime.     No current facility-administered medications for this visit.    Review of Systems : See HPI for pertinent positives and negatives.  Physical Examination  Filed Vitals:   10/29/14 1336 10/29/14 1339  BP: 132/69 142/62  Pulse: 65 65  Resp:  16  Height:  5\' 2"  (1.575 m)  Weight:  111 lb (50.349 kg)  SpO2:  100%   Body mass index is 20.3 kg/(m^2).  General: WDWN female in NAD GAIT: normal Eyes: PERRLA Pulmonary: Non-labored, CTAB, Negative Rales, Negative rhonchi, & Negative wheezing.  Cardiac: regular Rhythm , Negative detected murmur.  VASCULAR EXAM Carotid Bruits Left Right   Negative Negative   Radial pulses are palpable and equal.      LE Pulses LEFT RIGHT   POPLITEAL not palpable  not palpable   POSTERIOR TIBIAL  palpable   palpable    DORSALIS PEDIS  ANTERIOR  TIBIAL not palpable  palpable     Gastrointestinal: soft, nontender, BS WNL, no r/g, negative masses.  Musculoskeletal: Negative muscle atrophy/wasting. M/S 5/5 throughout, Extremities without ischemic changes.  Neurologic: A&O X 3; Appropriate Affect ; SENSATION ;normal;  Speech is normal CN 2-12 intact, Pain and light touch intact in extremities, Motor exam as listed above.         Non-Invasive Vascular Imaging CAROTID DUPLEX 10/29/2014   1. 50 - 69% right internal carotid artery stenosis. Right proximal ICA PSV/EDV today is 294/77,  was 215/56 in April 2015, increased stenosis.  2. Patent left carotid endarterectomy site with no evidence for restenosis.   Assessment: [name] is a 81 y.o. female who  is status post left CEA in 2005. She has no history of stroke or TIA. The  Right ICA stenosis is slightly advanced from previous exam according to end diastolic velocity at the right proximal internal carotid artery. The left carotid endarterectomy site remains patent.  Pt has minimized all of her modifiable atherosclerotic risk factors: she has never had DM, she has never smoked, she is on a statin, her blood pressure is under control, her weight is normal, she does not use ETOH, her diet is healthy, and she exercises 5 days/week.  She did have topical exposure to tobacco for years as a child helping her family raise tobacco; she also has a strong family history of CVD.  Plan: Follow-up in 6 months with Carotid Duplex.   I discussed in depth with the patient the nature of atherosclerosis, and emphasized the importance of maximal medical management including strict control of blood pressure, blood glucose, and lipid levels, obtaining regular exercise, and continued cessation of smoking.  The patient is aware that without maximal medical management the underlying atherosclerotic disease process will progress, limiting the benefit of any interventions. The patient was given information about stroke prevention and what symptoms should prompt the patient to seek immediate medical care. Thank you for allowing us to participate in this patient's care.  Charisse MarchSuzanne Nickel, RN, MSN, FNP-C [facility] Office: [phone]  Clinic Physician: Edilia BoDickson  10/29/2014 1:55 PM

## 2014-10-29 NOTE — Patient Instructions (Signed)
Stroke Prevention Some medical conditions and behaviors are associated with an increased chance of having a stroke. You may prevent a stroke by making healthy choices and managing medical conditions. HOW CAN I REDUCE MY RISK OF HAVING A STROKE?   Stay physically active. Get at least 30 minutes of activity on most or all days.  Do not smoke. It may also be helpful to avoid exposure to secondhand smoke.  Limit alcohol use. Moderate alcohol use is considered to be:  No more than 2 drinks per day for men.  No more than 1 drink per day for nonpregnant women.  Eat healthy foods. This involves:  Eating 5 or more servings of fruits and vegetables a day.  Making dietary changes that address high blood pressure (hypertension), high cholesterol, diabetes, or obesity.  Manage your cholesterol levels.  Making food choices that are high in fiber and low in saturated fat, trans fat, and cholesterol may control cholesterol levels.  Take any prescribed medicines to control cholesterol as directed by your health care provider.  Manage your diabetes.  Controlling your carbohydrate and sugar intake is recommended to manage diabetes.  Take any prescribed medicines to control diabetes as directed by your health care provider.  Control your hypertension.  Making food choices that are low in salt (sodium), saturated fat, trans fat, and cholesterol is recommended to manage hypertension.  Take any prescribed medicines to control hypertension as directed by your health care provider.  Maintain a healthy weight.  Reducing calorie intake and making food choices that are low in sodium, saturated fat, trans fat, and cholesterol are recommended to manage weight.  Stop drug abuse.  Avoid taking birth control pills.  Talk to your health care provider about the risks of taking birth control pills if you are over 35 years old, smoke, get migraines, or have ever had a blood clot.  Get evaluated for sleep  disorders (sleep apnea).  Talk to your health care provider about getting a sleep evaluation if you snore a lot or have excessive sleepiness.  Take medicines only as directed by your health care provider.  For some people, aspirin or blood thinners (anticoagulants) are helpful in reducing the risk of forming abnormal blood clots that can lead to stroke. If you have the irregular heart rhythm of atrial fibrillation, you should be on a blood thinner unless there is a good reason you cannot take them.  Understand all your medicine instructions.  Make sure that other conditions (such as anemia or atherosclerosis) are addressed. SEEK IMMEDIATE MEDICAL CARE IF:   You have sudden weakness or numbness of the face, arm, or leg, especially on one side of the body.  Your face or eyelid droops to one side.  You have sudden confusion.  You have trouble speaking (aphasia) or understanding.  You have sudden trouble seeing in one or both eyes.  You have sudden trouble walking.  You have dizziness.  You have a loss of balance or coordination.  You have a sudden, severe headache with no known cause.  You have new chest pain or an irregular heartbeat. Any of these symptoms may represent a serious problem that is an emergency. Do not wait to see if the symptoms will go away. Get medical help at once. Call your local emergency services (911 in [place] Do not drive yourself to the hospital. Document Released: 07/14/2004 Document Revised: 10/21/2013 Document Reviewed: 12/07/2012 [employer] Patient Information 2015 [employer]. This information is not intended to replace advice given   to you by your health care provider. Make sure you discuss any questions you have with your health care provider.  

## 2014-11-03 NOTE — Addendum Note (Signed)
Addended by: Melodye PedMANESS-HARRISON, CHANDA C on: 11/03/2014 10:06 AM   Modules accepted: Orders

## 2014-11-04 ENCOUNTER — Ambulatory Visit: Payer: Self-pay | Admitting: Cardiology

## 2015-02-14 ENCOUNTER — Emergency Department (HOSPITAL_BASED_OUTPATIENT_CLINIC_OR_DEPARTMENT_OTHER): Payer: Medicare Other

## 2015-02-14 ENCOUNTER — Emergency Department (HOSPITAL_BASED_OUTPATIENT_CLINIC_OR_DEPARTMENT_OTHER)
Admission: EM | Admit: 2015-02-14 | Discharge: 2015-02-14 | Disposition: A | Discharge status: Home / Self Care | Payer: Medicare Other | Attending: Emergency Medicine | Admitting: Emergency Medicine

## 2015-02-14 ENCOUNTER — Encounter (HOSPITAL_BASED_OUTPATIENT_CLINIC_OR_DEPARTMENT_OTHER): Payer: Self-pay | Admitting: *Deleted

## 2015-02-14 DIAGNOSIS — K5732 Diverticulitis of large intestine without perforation or abscess without bleeding: Secondary | ICD-10-CM | POA: Diagnosis not present

## 2015-02-14 DIAGNOSIS — E785 Hyperlipidemia, unspecified: Secondary | ICD-10-CM | POA: Diagnosis not present

## 2015-02-14 DIAGNOSIS — R509 Fever, unspecified: Secondary | ICD-10-CM | POA: Diagnosis not present

## 2015-02-14 DIAGNOSIS — Z7982 Long term (current) use of aspirin: Secondary | ICD-10-CM | POA: Insufficient documentation

## 2015-02-14 DIAGNOSIS — Z880 Allergy status to penicillin: Secondary | ICD-10-CM | POA: Insufficient documentation

## 2015-02-14 DIAGNOSIS — R1032 Left lower quadrant pain: Secondary | ICD-10-CM | POA: Diagnosis present

## 2015-02-14 DIAGNOSIS — Z79899 Other long term (current) drug therapy: Secondary | ICD-10-CM | POA: Diagnosis not present

## 2015-02-14 HISTORY — DX: Diverticulosis of intestine, part unspecified, without perforation or abscess without bleeding: K57.90

## 2015-02-14 LAB — COMPREHENSIVE METABOLIC PANEL
ALT: 14 U/L (ref 14–54)
AST: 28 U/L (ref 15–41)
Albumin: 3.6 g/dL (ref 3.5–5.0)
Alkaline Phosphatase: 58 U/L (ref 38–126)
Anion gap: 9 (ref 5–15)
BUN: 16 mg/dL (ref 6–20)
CHLORIDE: 100 mmol/L — AB (ref 101–111)
CO2: 29 mmol/L (ref 22–32)
CREATININE: 0.73 mg/dL (ref 0.44–1.00)
Calcium: 8.8 mg/dL — ABNORMAL LOW (ref 8.9–10.3)
GFR calc non Af Amer: >60 mL/min (ref >60)
Glucose, Bld: 112 mg/dL — ABNORMAL HIGH (ref 65–99)
POTASSIUM: 4 mmol/L (ref 3.5–5.1)
SODIUM: 138 mmol/L (ref 135–145)
Total Bilirubin: 0.6 mg/dL (ref 0.3–1.2)
Total Protein: 6.9 g/dL (ref 6.5–8.1)

## 2015-02-14 LAB — CBC
HEMATOCRIT: 38.2 % (ref 36.0–46.0)
Hemoglobin: 12.1 g/dL (ref 12.0–15.0)
MCH: 29.7 pg (ref 26.0–34.0)
MCHC: 31.7 g/dL (ref 30.0–36.0)
MCV: 93.6 fL (ref 78.0–100.0)
PLATELETS: 317 10*3/uL (ref 150–400)
RBC: 4.08 MIL/uL (ref 3.87–5.11)
RDW: 13 % (ref 11.5–15.5)
WBC: 12.2 10*3/uL — ABNORMAL HIGH (ref 4.0–10.5)

## 2015-02-14 LAB — URINALYSIS, ROUTINE W REFLEX MICROSCOPIC
Bilirubin Urine: NEGATIVE
GLUCOSE, UA: NEGATIVE mg/dL
KETONES UR: NEGATIVE mg/dL
LEUKOCYTES UA: NEGATIVE
NITRITE: NEGATIVE
PH: 7 (ref 5.0–8.0)
Protein, ur: NEGATIVE mg/dL
SPECIFIC GRAVITY, URINE: 1.006 (ref 1.005–1.030)
Urobilinogen, UA: 0.2 mg/dL (ref 0.0–1.0)

## 2015-02-14 LAB — URINE MICROSCOPIC-ADD ON

## 2015-02-14 LAB — LIPASE, BLOOD: Lipase: 34 U/L (ref 22–51)

## 2015-02-14 MED ORDER — CIPROFLOXACIN HCL 500 MG PO TABS: 500.0000 mg | ORAL_TABLET | Freq: Once | ORAL | Status: DC

## 2015-02-14 MED ORDER — IOHEXOL 300 MG/ML  SOLN
50.0000 mL | Freq: Once | INTRAMUSCULAR | Status: AC | PRN
  Administered 2015-02-14: 50 mL via ORAL

## 2015-02-14 MED ORDER — IOHEXOL 300 MG/ML  SOLN
100.0000 mL | Freq: Once | INTRAMUSCULAR | Status: AC | PRN
  Administered 2015-02-14: 100 mL via INTRAVENOUS

## 2015-02-14 MED ORDER — HYDROCODONE-ACETAMINOPHEN 5-325 MG PO TABS: 1.0000 | ORAL_TABLET | Freq: Four times a day (QID) | ORAL | Status: DC | PRN

## 2015-02-14 MED ORDER — CIPROFLOXACIN HCL 500 MG PO TABS: 500.0000 mg | ORAL_TABLET | Freq: Two times a day (BID) | ORAL | Status: DC

## 2015-02-14 MED ORDER — METRONIDAZOLE 500 MG PO TABS: 500.0000 mg | ORAL_TABLET | Freq: Once | ORAL | Status: DC

## 2015-02-14 MED ORDER — POLYETHYLENE GLYCOL 3350 17 GM/SCOOP PO POWD
17.0000 g | Freq: Every day | ORAL | Status: DC
Start: 2015-02-14 — End: 2020-01-08

## 2015-02-14 MED ORDER — METRONIDAZOLE 500 MG PO TABS: 500.0000 mg | ORAL_TABLET | Freq: Three times a day (TID) | ORAL | Status: DC

## 2015-02-14 NOTE — ED Provider Notes (Signed)
CSN: [id number]     Arrival date & time 02/14/15  1058 History   First MD Initiated Contact with Patient 02/14/15 1159     Chief Complaint  Patient presents with  . Abdominal Pain     (Consider location/radiation/quality/duration/timing/severity/associated sxs/prior Treatment) Patient is a 81 y.o. female presenting with abdominal pain. The history is provided by the patient and medical records. No language interpreter was used.  Abdominal Pain Associated symptoms: fever ( low grade)   Associated symptoms: no chest pain, no constipation, no cough, no diarrhea, no dysuria, no fatigue, no hematuria, no nausea, no shortness of breath and no vomiting      [name] is a 81 y.o. female  with a hx of HLD, GERD, diverticlosis presents to the Emergency Department complaining of gradual, persistent, progressively worsening LLQ abd pain onset yesterday morning. Associated symptoms include "unusual BMs," low grade fver to 99.9.  Pt states the "BMs were very firm and small balls."Pt reports she had a colonoscopy several years ago with diverticula.  Pt was seen at [facility] in Clinic and was sent here for a CT.  Pt reports no treatments PTA.  Nothing makes it better and nothing makes it worse.  Pt denies chills, headache, neck pain, chest pain, SOB, vomiting, weakness, dizziness, syncope, dysuria, melena, hematochezia.  Pt denies abdominal surgery.  Pt reports her last BM was yesterday and was large and hard.      Past Medical History  Diagnosis Date  . Hyperlipidemia   . GERD (gastroesophageal reflux disease)   . Carotid artery occlusion   . Diverticulosis    Past Surgical History  Procedure Laterality Date  . Eye surgery  June 27,2013    Right eye- Cataract  . Eye surgery  July 11,2013    Left eye - Cataract  . Carotid endarterectomy Left Nov. 2005    Left carotid  . Tonsillectomy     Family History  Problem Relation Age of Onset  . Stroke Mother 65    Multiple  . Hypertension  Mother   . Heart disease Mother   . Heart attack Mother   . Cancer Father     Bladder   . Heart attack Father   . Heart disease Father   . Hypertension Sister   . Heart disease Sister     After age 38-  Carotid Artery Disease   Social History  Substance Use Topics  . Smoking status: Never Smoker   . Smokeless tobacco: Never Used  . Alcohol Use: No   OB History    No data available     Review of Systems  Constitutional: Positive for fever ( low grade). Negative for diaphoresis, appetite change, fatigue and unexpected weight change.  HENT: Negative for mouth sores.   Eyes: Negative for visual disturbance.  Respiratory: Negative for cough, chest tightness, shortness of breath and wheezing.   Cardiovascular: Negative for chest pain.  Gastrointestinal: Positive for abdominal pain. Negative for nausea, vomiting, diarrhea and constipation.  Endocrine: Negative for polydipsia, polyphagia and polyuria.  Genitourinary: Negative for dysuria, urgency, frequency and hematuria.  Musculoskeletal: Negative for back pain and neck stiffness.  Skin: Negative for rash.  Allergic/Immunologic: Negative for immunocompromised state.  Neurological: Negative for syncope, light-headedness and headaches.  Hematological: Does not bruise/bleed easily.  Psychiatric/Behavioral: Negative for sleep disturbance. The patient is not nervous/anxious.       Allergies  Amoxicillin  Home Medications   Prior to Admission medications   Medication Sig Start  Date End Date Taking? Authorizing Provider  aspirin 81 MG tablet Take 81 mg by mouth daily.    Yes Historical Provider, MD  Calcium-Vitamin D (CALTRATE 600 PLUS-VIT D PO) Take 2 tablets by mouth daily.   Yes Historical Provider, MD  fish oil-omega-3 fatty acids 1000 MG capsule Take 1,000 mg by mouth 2 (two) times daily.    Yes Historical Provider, MD  Multiple Vitamin (MULTIVITAMIN) tablet Take 1 tablet by mouth daily.   Yes Historical Provider, MD   nitroGLYCERIN (NITROSTAT) 0.4 MG SL tablet Place 0.4 mg under the tongue every 5 (five) minutes as needed for chest pain (for chest pain).   Yes Historical Provider, MD  simvastatin (ZOCOR) 5 MG tablet Take 5 mg by mouth at bedtime.   Yes Historical Provider, MD  ciprofloxacin (CIPRO) 500 MG tablet Take 1 tablet (500 mg total) by mouth 2 (two) times daily. One po bid x 10 days 02/14/15   Dahlia Client Frimy Uffelman, PA-C  HYDROcodone-acetaminophen (NORCO/VICODIN) 5-325 MG per tablet Take 1 tablet by mouth every 6 (six) hours as needed for moderate pain or severe pain. 02/14/15   Jovonne Wilton, PA-C  metroNIDAZOLE (FLAGYL) 500 MG tablet Take 1 tablet (500 mg total) by mouth 3 (three) times daily. 02/14/15   Derionna Salvador, PA-C  polyethylene glycol powder (GLYCOLAX/MIRALAX) powder Take 17 g by mouth daily. Until daily soft stools  OTC 02/14/15   Jayleon Mcfarlane, PA-C   BP 135/57 mmHg  Pulse 81  Temp(Src) 98.6 F (37 C) (Oral)  Resp 20  Ht 5\' 2"  (1.575 m)  Wt 110 lb (49.896 kg)  BMI 20.11 kg/m2  SpO2 99% Physical Exam  Constitutional: She appears well-developed and well-nourished. No distress.  Awake, alert, nontoxic appearance  HENT:  Head: Normocephalic and atraumatic.  Mouth/Throat: Oropharynx is clear and moist. No oropharyngeal exudate.  Eyes: Conjunctivae are normal. No scleral icterus.  Neck: Normal range of motion. Neck supple.  Cardiovascular: Normal rate, regular rhythm, normal heart sounds and intact distal pulses.   No murmur heard. Pulmonary/Chest: Effort normal and breath sounds normal. No respiratory distress. She has no wheezes.  Equal chest expansion  Abdominal: Soft. Bowel sounds are normal. She exhibits no mass. There is tenderness in the left lower quadrant. There is no rebound, no guarding and no CVA tenderness.  Musculoskeletal: Normal range of motion. She exhibits no edema.  Neurological: She is alert.  Speech is clear and goal oriented Moves extremities  without ataxia  Skin: Skin is warm and dry. She is not diaphoretic.  Psychiatric: She has a normal mood and affect.  Nursing note and vitals reviewed.   ED Course  Procedures (including critical care time) Labs Review Labs Reviewed  COMPREHENSIVE METABOLIC PANEL - Abnormal; Notable for the following:    Chloride 100 (*)    Glucose, Bld 112 (*)    Calcium 8.8 (*)    All other components within normal limits  CBC - Abnormal; Notable for the following:    WBC 12.2 (*)    All other components within normal limits  URINALYSIS, ROUTINE W REFLEX MICROSCOPIC (NOT AT [facility]) - Abnormal; Notable for the following:    Hgb urine dipstick SMALL (*)    All other components within normal limits  LIPASE, BLOOD  URINE MICROSCOPIC-ADD ON    Imaging Review Ct Abdomen Pelvis W Contrast  02/14/2015   CLINICAL DATA:  Left lower quadrant pain. History of diverticulosis. Elevated white count.  EXAM: CT ABDOMEN AND PELVIS WITH CONTRAST  TECHNIQUE: Multidetector  CT imaging of the abdomen and pelvis was performed using the standard protocol following bolus administration of intravenous contrast.  CONTRAST:  OMNIPAQUE IOHEXOL 300 MG/ML SOLN, 50mL OMNIPAQUE IOHEXOL 300 MG/ML SOLN  COMPARISON:  None.  FINDINGS: Linear areas of scarring in the lung bases bilaterally. Heart is normal size. Coronary artery calcifications. No effusions.  Small cyst in the right lower liver. Otherwise unremarkable liver. Gallbladder, spleen, pancreas, adrenals and kidneys are unremarkable.  There is diffuse colonic diverticulosis. Inflammatory stranding around the mid and distal sigmoid colon compatible with active diverticulitis. No focal fluid collections. No free air. Large stool burden throughout the colon. Stomach and small bowel are decompressed and grossly unremarkable.  Aortic and iliac calcifications without aneurysm. Urinary bladder and uterus are unremarkable. No adnexal masses.  IMPRESSION: Diffuse colonic diverticulosis  with active diverticulitis. No complicating feature.  Large stool burden.  Coronary artery and aortic atherosclerosis.   Electronically Signed   By: Charlett Nose M.D.   On: 02/14/2015 14:19   I have personally reviewed and evaluated these images and lab results as part of my medical decision-making.   EKG Interpretation None      MDM   Final diagnoses:  Diverticulitis of large intestine without perforation or abscess without bleeding   [name] presents with Hx of diverticula and LLQ abd pain with low grade fever. Suspect diverticulitis.  Will obtain CT scan.  Labwork from [place] physicians reviewed with leukocytosis of 14.4 and negative fecal occult.  UA without evidence of UTI.  Pt resting comfortably at this time  2:52 PM CT scan with evidence of diverticulitis. No evidence of abscess or perforation. Patient continues to remain well appearing. She is afebrile and tolerating by mouth here in the emergency department. No tachycardia or hypotension. No evidence of sepsis.  Patient is without a surgical abdomen. She will need follow-up with her primary care within 2 days. She will be discharged home with antibiotics and pain medicine as needed for severe pain. Discussed use of MiraLAX to reduce constipation and straining.  The patient was discussed with and seen by Dr. Radford Pax who agrees with the treatment plan.   Dahlia Client Ardith Lewman, PA-C 02/14/15 1453  Nelva Nay, MD 02/14/15 [phone]

## 2015-02-14 NOTE — Discharge Instructions (Signed)
1. Medications: Ciprofloxacin, Flagyl, MiraLAX every day, vicodin as needed for severe pain, usual home medications 2. Treatment: rest, drink plenty of fluids, advance diet slowly 3. Follow Up: Please followup with your primary doctor in 2 days for discussion of your diagnoses and further evaluation after today's visit; if you do not have a primary care doctor use the resource guide provided to find one; Please return to the ER for persistent vomiting, high fevers or worsening symptoms    Diverticulitis Diverticulitis is when small pockets that have formed in your colon (large intestine) become infected or swollen. HOME CARE  Follow your doctor's instructions.  Follow a special diet if told by your doctor.  When you feel better, your doctor may tell you to change your diet. You may be told to eat a lot of fiber. Fruits and vegetables are good sources of fiber. Fiber makes it easier to poop (have bowel movements).  Take supplements or probiotics as told by your doctor.  Only take medicines as told by your doctor.  Keep all follow-up visits with your doctor. GET HELP IF:  Your pain does not get better.  You have a hard time eating food.  You are not pooping like normal. GET HELP RIGHT AWAY IF:  Your pain gets worse.  Your problems do not get better.  Your problems suddenly get worse.  You have a fever.  You keep throwing up (vomiting).  You have bloody or black, tarry poop (stool). MAKE SURE YOU:   Understand these instructions.  Will watch your condition.  Will get help right away if you are not doing well or get worse. Document Released: 11/23/2007 Document Revised: 06/11/2013 Document Reviewed: 05/01/2013 [facility] Patient Information 2015 [place], [place]. This information is not intended to replace advice given to you by your health care provider. Make sure you discuss any questions you have with your health care provider.

## 2015-02-14 NOTE — ED Notes (Addendum)
Pt reports left lower abd pain since yesterday- hx of diverticulosis- seen at [facility] this morning and CBC showed elevated WBC- sent for CT

## 2015-02-14 NOTE — ED Notes (Signed)
MD at bedside. 

## 2015-02-19 DIAGNOSIS — K5790 Diverticulosis of intestine, part unspecified, without perforation or abscess without bleeding: Secondary | ICD-10-CM

## 2015-02-19 HISTORY — DX: Diverticulosis of intestine, part unspecified, without perforation or abscess without bleeding: K57.90

## 2015-04-07 DIAGNOSIS — I639 Cerebral infarction, unspecified: Secondary | ICD-10-CM

## 2015-04-07 HISTORY — DX: Cerebral infarction, unspecified: I63.9

## 2015-04-08 DIAGNOSIS — E785 Hyperlipidemia, unspecified: Secondary | ICD-10-CM | POA: Insufficient documentation

## 2015-04-08 DIAGNOSIS — I619 Nontraumatic intracerebral hemorrhage, unspecified: Secondary | ICD-10-CM | POA: Insufficient documentation

## 2015-05-05 ENCOUNTER — Encounter: Payer: Self-pay | Admitting: Family

## 2015-05-07 ENCOUNTER — Encounter: Payer: Self-pay | Admitting: Family

## 2015-05-07 ENCOUNTER — Ambulatory Visit (HOSPITAL_COMMUNITY)
Admission: RE | Admit: 2015-05-07 | Discharge: 2015-05-07 | Disposition: A | Discharge status: Home / Self Care | Payer: Medicare Other | Source: Ambulatory Visit | Attending: Family | Admitting: Family

## 2015-05-07 ENCOUNTER — Ambulatory Visit (INDEPENDENT_AMBULATORY_CARE_PROVIDER_SITE_OTHER): Payer: Medicare Other | Admitting: Family

## 2015-05-07 VITALS — BP 118/59 | HR 76 | Temp 98.6°F | Resp 16 | Ht 61.5 in | Wt 114.0 lb

## 2015-05-07 DIAGNOSIS — Z48812 Encounter for surgical aftercare following surgery on the circulatory system: Secondary | ICD-10-CM | POA: Diagnosis not present

## 2015-05-07 DIAGNOSIS — Z9889 Other specified postprocedural states: Secondary | ICD-10-CM | POA: Diagnosis not present

## 2015-05-07 DIAGNOSIS — I6521 Occlusion and stenosis of right carotid artery: Secondary | ICD-10-CM | POA: Diagnosis not present

## 2015-05-07 NOTE — Patient Instructions (Signed)
Stroke Prevention Some medical conditions and behaviors are associated with an increased chance of having a stroke. You may prevent a stroke by making healthy choices and managing medical conditions. HOW CAN I REDUCE MY RISK OF HAVING A STROKE?   Stay physically active. Get at least 30 minutes of activity on most or all days.  Do not smoke. It may also be helpful to avoid exposure to secondhand smoke.  Limit alcohol use. Moderate alcohol use is considered to be:  No more than 2 drinks per day for men.  No more than 1 drink per day for nonpregnant women.  Eat healthy foods. This involves:  Eating 5 or more servings of fruits and vegetables a day.  Making dietary changes that address high blood pressure (hypertension), high cholesterol, diabetes, or obesity.  Manage your cholesterol levels.  Making food choices that are high in fiber and low in saturated fat, trans fat, and cholesterol may control cholesterol levels.  Take any prescribed medicines to control cholesterol as directed by your health care provider.  Manage your diabetes.  Controlling your carbohydrate and sugar intake is recommended to manage diabetes.  Take any prescribed medicines to control diabetes as directed by your health care provider.  Control your hypertension.  Making food choices that are low in salt (sodium), saturated fat, trans fat, and cholesterol is recommended to manage hypertension.  Ask your health care provider if you need treatment to lower your blood pressure. Take any prescribed medicines to control hypertension as directed by your health care provider.  If you are 18-39 years of age, have your blood pressure checked every 3-5 years. If you are 40 years of age or older, have your blood pressure checked every year.  Maintain a healthy weight.  Reducing calorie intake and making food choices that are low in sodium, saturated fat, trans fat, and cholesterol are recommended to manage  weight.  Stop drug abuse.  Avoid taking birth control pills.  Talk to your health care provider about the risks of taking birth control pills if you are over 35 years old, smoke, get migraines, or have ever had a blood clot.  Get evaluated for sleep disorders (sleep apnea).  Talk to your health care provider about getting a sleep evaluation if you snore a lot or have excessive sleepiness.  Take medicines only as directed by your health care provider.  For some people, aspirin or blood thinners (anticoagulants) are helpful in reducing the risk of forming abnormal blood clots that can lead to stroke. If you have the irregular heart rhythm of atrial fibrillation, you should be on a blood thinner unless there is a good reason you cannot take them.  Understand all your medicine instructions.  Make sure that other conditions (such as anemia or atherosclerosis) are addressed. SEEK IMMEDIATE MEDICAL CARE IF:   You have sudden weakness or numbness of the face, arm, or leg, especially on one side of the body.  Your face or eyelid droops to one side.  You have sudden confusion.  You have trouble speaking (aphasia) or understanding.  You have sudden trouble seeing in one or both eyes.  You have sudden trouble walking.  You have dizziness.  You have a loss of balance or coordination.  You have a sudden, severe headache with no known cause.  You have new chest pain or an irregular heartbeat. Any of these symptoms may represent a serious problem that is an emergency. Do not wait to see if the symptoms will   go away. Get medical help at once. Call your local emergency services (911 in [place] Do not drive yourself to the hospital.   This information is not intended to replace advice given to you by your health care provider. Make sure you discuss any questions you have with your health care provider.   Document Released: 07/14/2004 Document Revised: 06/27/2014 Document Reviewed:  12/07/2012 Elsevier Interactive Patient Education 2016 Elsevier Inc.  

## 2015-05-07 NOTE — Progress Notes (Signed)
Established Carotid Patient   History of Present Illness  [name] is a 81 y.o. female patient of Dr. Arbie Cookey who is status post left CEA in 2005 who presents today for follow up.  She has no history of TIA or stroke symptoms. Specifically the patient denies any history of amaurosis fugax or monocular blindness, unilateral facial drooping, hemiplegia, or receptive or expressive aphasia. She denies claudication symptoms with walking.  Patient reports New Medical or Surgical History: she had a stroke 04/07/15, admitted to [facility] in [place]. Husband states he was told that she had a hemorrhagic stroke. She had right hemiparesis and expressive aphasia, denies monocular loss of vision. Her blood pressure was taken at the time and states it was 220 systolic.  She is scheduled to see a neurosurgeon, Dr. Genice Rouge...?, in 4 days, states he will request a CT of her brain. Her handwriting has been mildly affected and her ability to do math. She has no ambulating difficulties.   She has a bout of diverticulitis before the stroke.   Cardiac evaluation for chest pressure, stress test April 2016 was normal per pt.  Pt Diabetic: No Pt smoker: non-smoker She exercises 5 days/week.  Pt meds include: Statin : Yes ASA: no, she will find out from the neurosurgeon she sees whether to resume Other anticoagulants/antiplatelets: no    Past Medical History  Diagnosis Date  . Hyperlipidemia   . GERD (gastroesophageal reflux disease)   . Carotid artery occlusion   . Diverticulosis Sept. 2016  . Stroke [facility]) Oct.  18, 2016    Social History Social History  Substance Use Topics  . Smoking status: Never Smoker   . Smokeless tobacco: Never Used  . Alcohol Use: No    Family History Family History  Problem Relation Age of Onset  . Stroke Mother 25    Multiple  . Hypertension Mother   . Heart disease Mother   . Heart attack Mother   . Cancer Father      Bladder   . Heart attack Father   . Heart disease Father   . Hypertension Sister   . Heart disease Sister     After age 73-  Carotid Artery Disease    Surgical History Past Surgical History  Procedure Laterality Date  . Eye surgery  June 27,2013    Right eye- Cataract  . Eye surgery  July 11,2013    Left eye - Cataract  . Carotid endarterectomy Left Nov. 2005    Left carotid  . Tonsillectomy      Allergies  Allergen Reactions  . Amoxicillin Diarrhea    Current Outpatient Prescriptions  Medication Sig Dispense Refill  . amLODipine (NORVASC) 5 MG tablet Take 5 mg by mouth daily.    . calcium carbonate (TUMS) 500 MG chewable tablet Chew 1 tablet by mouth 2 (two) times daily.    . Calcium-Vitamin D (CALTRATE 600 PLUS-VIT D PO) Take 2 tablets by mouth daily.    . fish oil-omega-3 fatty acids 1000 MG capsule Take 1,000 mg by mouth 2 (two) times daily.     . Multiple Vitamin (MULTIVITAMIN) tablet Take 1 tablet by mouth daily.    . nitroGLYCERIN (NITROSTAT) 0.4 MG SL tablet Place 0.4 mg under the tongue every 5 (five) minutes as needed for chest pain (for chest pain).    . simvastatin (ZOCOR) 5 MG tablet Take 5 mg by mouth at bedtime.    Marland Kitchen aspirin 81  MG tablet Take 81 mg by mouth daily.     . ciprofloxacin (CIPRO) 500 MG tablet Take 1 tablet (500 mg total) by mouth 2 (two) times daily. One po bid x 10 days (Patient not taking: Reported on 05/07/2015) 20 tablet 0  . HYDROcodone-acetaminophen (NORCO/VICODIN) 5-325 MG per tablet Take 1 tablet by mouth every 6 (six) hours as needed for moderate pain or severe pain. (Patient not taking: Reported on 05/07/2015) 6 tablet 0  . metroNIDAZOLE (FLAGYL) 500 MG tablet Take 1 tablet (500 mg total) by mouth 3 (three) times daily. (Patient not taking: Reported on 05/07/2015) 30 tablet 0  . polyethylene glycol powder (GLYCOLAX/MIRALAX) powder Take 17 g by mouth daily. Until daily soft stools  OTC (Patient not taking: Reported on 05/07/2015) 250 g 0    No current facility-administered medications for this visit.    Review of Systems : See HPI for pertinent positives and negatives.  Physical Examination  Filed Vitals:   05/07/15 1100 05/07/15 1103  BP: 117/68 118/59  Pulse: 80 76  Temp:  98.6 F (37 C)  TempSrc:  Oral  Resp:  16  Height:  5' 1.5" (1.562 m)  Weight:  114 lb (51.71 kg)  SpO2:  100%   Body mass index is 21.19 kg/(m^2).  General: WDWN female in NAD GAIT: normal Eyes: PERRLA Pulmonary: Non-labored, CTAB, no rales, rhonchi, or wheezing.  Cardiac: regular rhythm, no detected murmur.  VASCULAR EXAM Carotid Bruits Left Right   Negative Negative   Radial pulses are palpable and equal.      LE Pulses LEFT RIGHT   POPLITEAL not palpable  not palpable   POSTERIOR TIBIAL  palpable   palpable    DORSALIS PEDIS  ANTERIOR TIBIAL not palpable  palpable     Gastrointestinal: soft, nontender, BS WNL, no r/g,no palpable masses.  Musculoskeletal: No muscle atrophy/wasting. M/S 5/5 throughout, Extremities without ischemic changes.  Neurologic: A&O X 3; Appropriate Affect, Speech is normal CN 2-12 intact, Pain and light touch intact in extremities, Motor exam as listed above.               Non-Invasive Vascular Imaging CAROTID DUPLEX 05/07/2015   CEREBROVASCULAR DUPLEX EVALUATION    INDICATION: Carotid artery disease recent CVA    PREVIOUS INTERVENTION(S): Left carotid endarterectomy 2005    DUPLEX EXAM: Carotid duplex    RIGHT  LEFT  Peak Systolic Velocities (cm/s) End Diastolic Velocities (cm/s) Plaque LOCATION Peak Systolic Velocities (cm/s) End Diastolic Velocities (cm/s) Plaque  93 21 - CCA PROXIMAL 122 22 HM  85 19 - CCA MID 99 18 -  77 16 HT CCA DISTAL 100 21 -  214 28 - ECA 215 22 HT  255 70  CP ICA PROXIMAL 75 13 -  210 47 HT ICA MID 94 26 -  133 36 - ICA DISTAL 98 26 -    3.0 ICA / CCA Ratio (PSV) N/A  Antegrade Vertebral Flow Antegrade  - Brachial Systolic Pressure (mmHg) -  Triphasic Brachial Artery Waveforms Triphasic    Plaque Morphology:  HM = Homogeneous, HT = Heterogeneous, CP = Calcific Plaque, SP = Smooth Plaque, IP = Irregular Plaque  ADDITIONAL FINDINGS: Multiphasic subclavian artery waveforms.    IMPRESSION: 1. 60 - 79% right internal carotid artery stenosis, calcific plaque may obscure higher velocity. 2. Patent left carotid endarterectomy site with no evidence for restenosis.    Compared to the previous exam:  Increased velocity since prior exam     Assessment: Talbert Forest  Clydene LamingH Reinhardt is a 81 y.o. female who is status post left CEA in 2005. She has no history of stroke or TIA. Carotid duplex today suggests 60 - 79% right internal carotid artery stenosis, calcific plaque may obscure higher velocity, and a patent left carotid endarterectomy site with no evidence for restenosis. No significant change compared to EDV's on 10/29/14.   Plan: Follow-up in 6 months with Carotid Duplex scan.   I discussed in depth with the patient the nature of atherosclerosis, and emphasized the importance of maximal medical management including strict control of blood pressure, blood glucose, and lipid levels, obtaining regular exercise, and continued cessation of smoking.  The patient is aware that without maximal medical management the underlying atherosclerotic disease process will progress, limiting the benefit of any interventions. The patient was given information about stroke prevention and what symptoms should prompt the patient to seek immediate medical care. Thank you for allowing us to participate in this patient's care.  Charisse MarchSuzanne Myelle Poteat, RN, MSN, FNP-C [facility] [place] Office: [phone]  Clinic Physician: Darrick PennaFields  05/07/2015 11:23 AM

## 2015-05-20 ENCOUNTER — Other Ambulatory Visit: Payer: Self-pay | Admitting: Neurosurgery

## 2015-05-20 DIAGNOSIS — I619 Nontraumatic intracerebral hemorrhage, unspecified: Secondary | ICD-10-CM

## 2015-05-21 ENCOUNTER — Ambulatory Visit
Admission: RE | Admit: 2015-05-21 | Discharge: 2015-05-21 | Disposition: A | Discharge status: Home / Self Care | Payer: Medicare Other | Source: Ambulatory Visit | Attending: Neurosurgery | Admitting: Neurosurgery

## 2015-05-21 DIAGNOSIS — I619 Nontraumatic intracerebral hemorrhage, unspecified: Secondary | ICD-10-CM

## 2015-05-26 ENCOUNTER — Other Ambulatory Visit: Payer: Self-pay

## 2015-05-26 DIAGNOSIS — Z1231 Encounter for screening mammogram for malignant neoplasm of breast: Secondary | ICD-10-CM

## 2015-06-26 ENCOUNTER — Ambulatory Visit: Payer: Medicare Other

## 2015-07-13 ENCOUNTER — Ambulatory Visit
Admission: RE | Admit: 2015-07-13 | Discharge: 2015-07-13 | Disposition: A | Discharge status: Home / Self Care | Payer: Medicare Other | Source: Ambulatory Visit

## 2015-07-13 DIAGNOSIS — Z1231 Encounter for screening mammogram for malignant neoplasm of breast: Secondary | ICD-10-CM

## 2015-11-05 ENCOUNTER — Ambulatory Visit: Payer: Medicare Other | Admitting: Family

## 2015-11-05 ENCOUNTER — Encounter (HOSPITAL_COMMUNITY): Payer: Medicare Other

## 2015-12-25 ENCOUNTER — Ambulatory Visit (HOSPITAL_COMMUNITY)
Admission: RE | Admit: 2015-12-25 | Discharge: 2015-12-25 | Disposition: A | Discharge status: Home / Self Care | Payer: Medicare Other | Source: Ambulatory Visit | Attending: Family | Admitting: Family

## 2015-12-25 DIAGNOSIS — I6521 Occlusion and stenosis of right carotid artery: Secondary | ICD-10-CM | POA: Insufficient documentation

## 2015-12-25 DIAGNOSIS — Z48812 Encounter for surgical aftercare following surgery on the circulatory system: Secondary | ICD-10-CM

## 2015-12-25 DIAGNOSIS — Z9889 Other specified postprocedural states: Secondary | ICD-10-CM | POA: Diagnosis not present

## 2015-12-25 LAB — VAS US CAROTID
LCCAPDIAS: 18 cm/s
LCCAPSYS: 122 cm/s
LEFT ECA DIAS: -12 cm/s
LEFT VERTEBRAL DIAS: 42 cm/s
LICAPDIAS: -13 cm/s
Left CCA dist dias: 16 cm/s
Left CCA dist sys: 90 cm/s
Left ICA prox sys: -75 cm/s
RCCAPDIAS: 20 cm/s
RIGHT CCA MID DIAS: -13 cm/s
RIGHT ECA DIAS: -16 cm/s
Right CCA prox sys: 117 cm/s
Right cca dist sys: -73 cm/s

## 2016-01-05 ENCOUNTER — Telehealth: Payer: Self-pay

## 2016-01-05 DIAGNOSIS — I739 Peripheral vascular disease, unspecified: Principal | ICD-10-CM

## 2016-01-05 DIAGNOSIS — I779 Disorder of arteries and arterioles, unspecified: Secondary | ICD-10-CM

## 2016-01-05 DIAGNOSIS — Z48812 Encounter for surgical aftercare following surgery on the circulatory system: Secondary | ICD-10-CM

## 2016-01-05 DIAGNOSIS — Z9889 Other specified postprocedural states: Secondary | ICD-10-CM

## 2016-01-05 NOTE — Telephone Encounter (Signed)
Pt. called to request results of the carotid ultrasound done on 12/25/15.  S. Nickel, NP, reviewed the pt's medical record, and recent Carotid US.  Advised there is noted to be "moderate to severe blockage in (R) carotid artery, and no blockage or plaque in the left carotid artery, where the blockage was removed in 2005."   Pt. was informed of the results.  Questioned the pt. if she has experienced any sx's of TIA or stroke?  Pt. denied any amaurosis fugax, unilateral weakness, slurred speech, disorientation, unsteadiness, facial droop, or dizziness.  The pt. related hx. of having a "brain bleed" back in October 2016, and was hospitalized at [facility] for 2 [facility].  Denied any other problems since the episode in October.  Per NP, the pt. should f/u in 6 mos., with carotid duplex and office exam.  Advised pt. she will be notified of 6 mo. F/u. Appt.  Verb. Understanding of information given.

## 2016-01-07 NOTE — Telephone Encounter (Signed)
Appt scheduled for 07/12/16 for carotid US and to see TFE. I left message for the patient regarding this appt/

## 2016-05-16 IMAGING — MG MM DIGITAL SCREENING BILAT W/ CAD
4 series · 4 of 4 positions shown · non-contrast
Comparison: Previous exam(s).

CLINICAL DATA: Screening.

EXAM:
DIGITAL SCREENING BILATERAL MAMMOGRAM WITH CAD

[R CC]
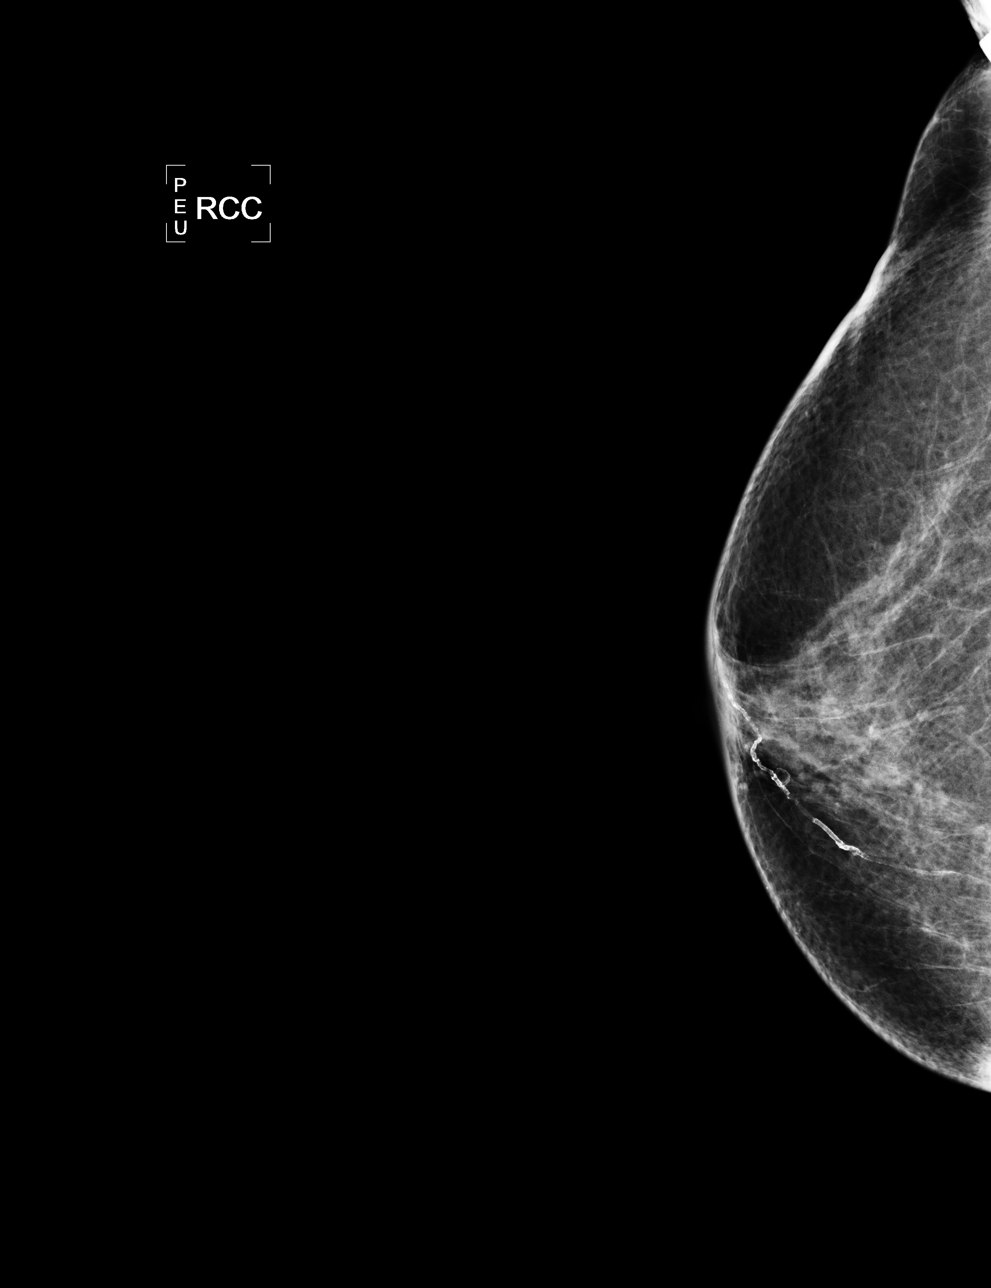

[L CC]
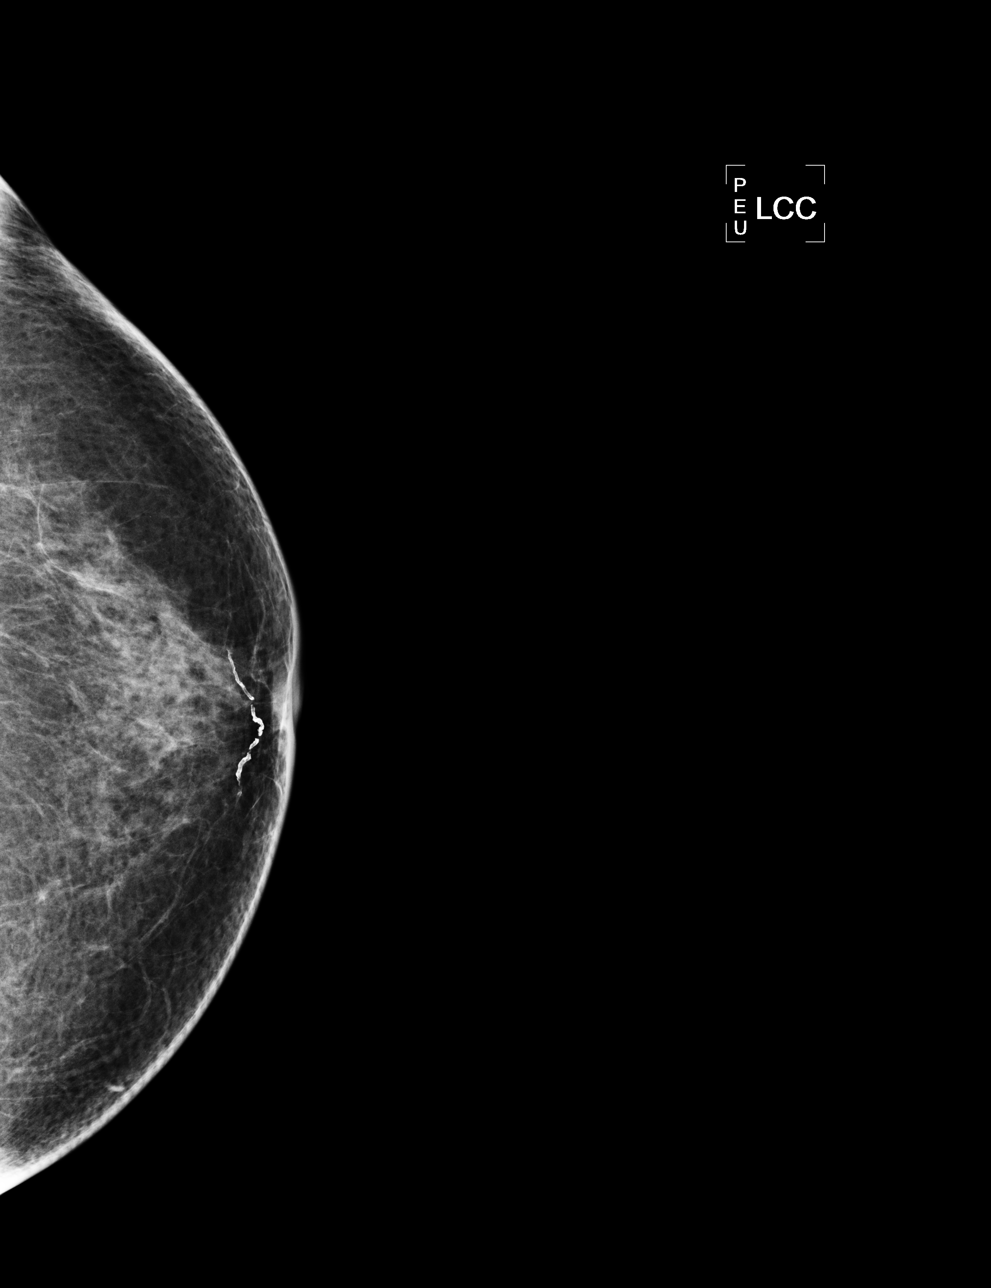

[L MLO]
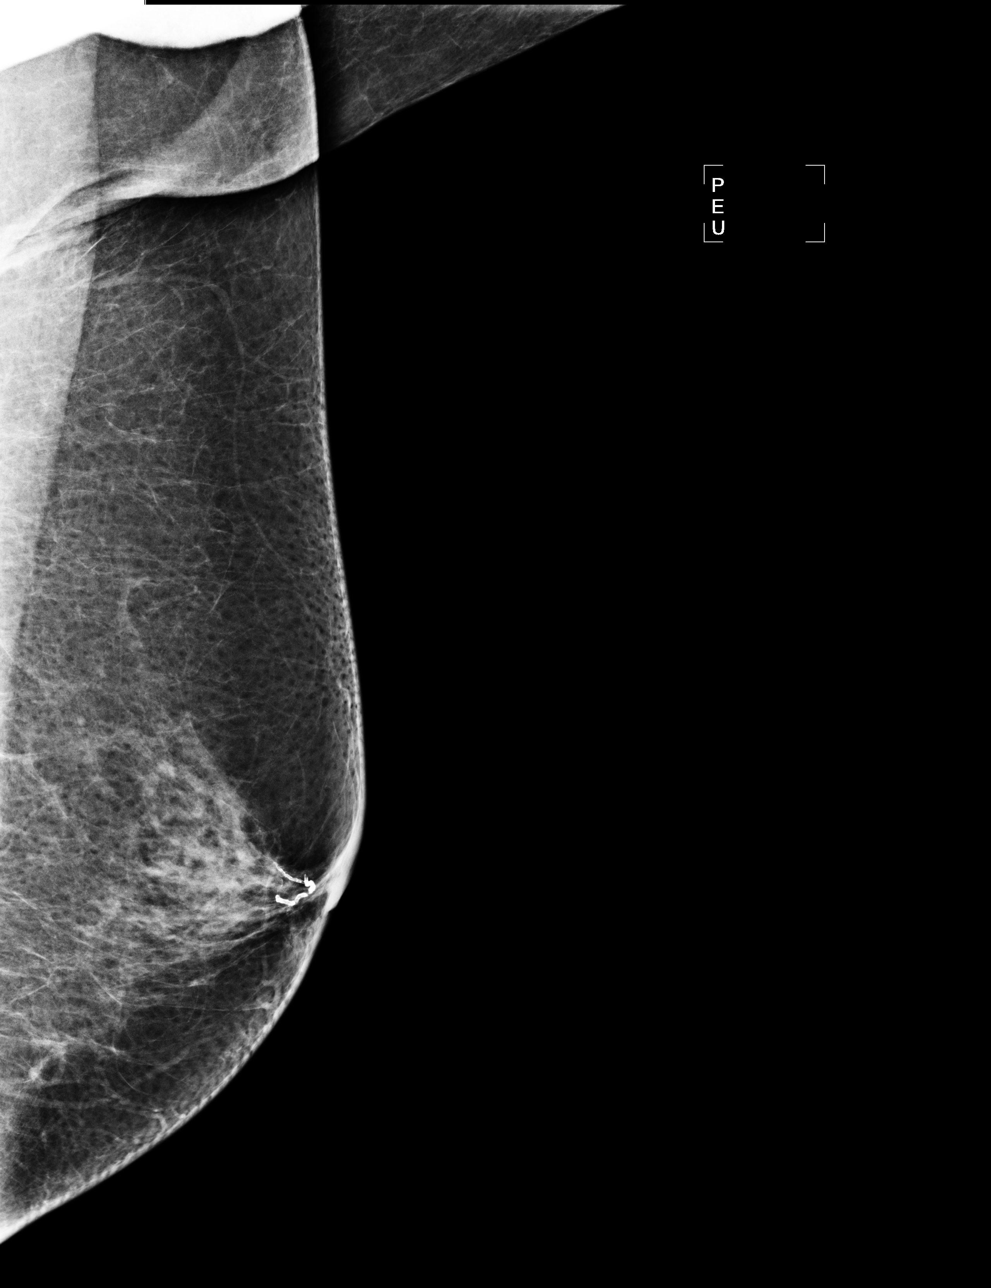

[R MLO]
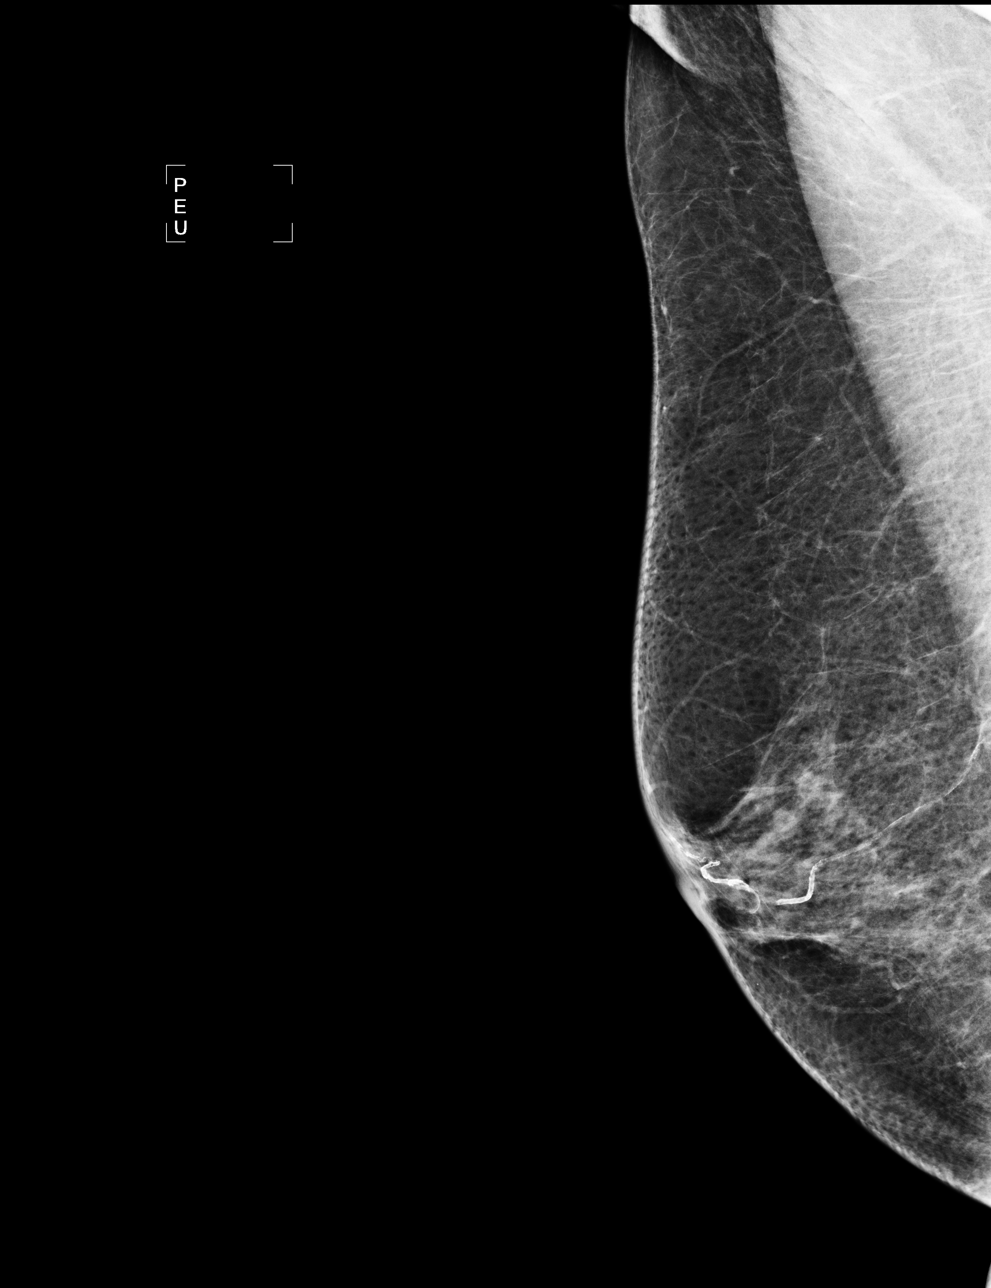

[4 of 4 positions shown; findings below may reference images not displayed]

ACR Breast Density Category b: There are scattered areas of
fibroglandular density.
FINDINGS: There are no findings suspicious for malignancy. Images were
processed with CAD.
IMPRESSION: No mammographic evidence of malignancy. A result letter of this
screening mammogram will be mailed directly to the patient.

RECOMMENDATION:
Screening mammogram in one year. (Code:AS-G-LCT)

BI-RADS CATEGORY  1: Negative.

## 2016-06-06 ENCOUNTER — Other Ambulatory Visit: Payer: Self-pay | Admitting: Geriatric Medicine

## 2016-06-06 DIAGNOSIS — Z1231 Encounter for screening mammogram for malignant neoplasm of breast: Secondary | ICD-10-CM

## 2016-07-07 ENCOUNTER — Encounter: Payer: Self-pay | Admitting: Vascular Surgery

## 2016-07-12 ENCOUNTER — Encounter: Payer: Self-pay | Admitting: Vascular Surgery

## 2016-07-12 ENCOUNTER — Ambulatory Visit (INDEPENDENT_AMBULATORY_CARE_PROVIDER_SITE_OTHER): Payer: Medicare Other | Admitting: Vascular Surgery

## 2016-07-12 ENCOUNTER — Ambulatory Visit (HOSPITAL_COMMUNITY)
Admission: RE | Admit: 2016-07-12 | Discharge: 2016-07-12 | Disposition: A | Discharge status: Home / Self Care | Payer: Medicare Other | Source: Ambulatory Visit | Attending: Vascular Surgery | Admitting: Vascular Surgery

## 2016-07-12 VITALS — BP 114/71 | HR 73 | Temp 98.4°F | Resp 18 | Ht 61.5 in | Wt 114.4 lb

## 2016-07-12 DIAGNOSIS — Z9889 Other specified postprocedural states: Secondary | ICD-10-CM

## 2016-07-12 DIAGNOSIS — I739 Peripheral vascular disease, unspecified: Secondary | ICD-10-CM

## 2016-07-12 DIAGNOSIS — I779 Disorder of arteries and arterioles, unspecified: Secondary | ICD-10-CM | POA: Insufficient documentation

## 2016-07-12 DIAGNOSIS — Z48812 Encounter for surgical aftercare following surgery on the circulatory system: Secondary | ICD-10-CM | POA: Diagnosis not present

## 2016-07-12 NOTE — Progress Notes (Signed)
[facility] [place]  Patient name: [name] MRN: [id number] DOB: [birth year] Sex: female  REASON FOR VISIT: Follow-up carotid disease  HPI: [name] is a 83 y.o. female here today for discussion of extracranial cerebrovascular occlusive disease. She is well-known to me from a prior left carotid endarterectomy in 2005. She has been followed in our office with serial carotid ultrasounds. Her most recent study in 6 months ago suggested a possible progression of disease in her right carotid and she is seen today for follow-up and further discussion. Fourth and she remains completely asymptomatic from a right brain standpoint. She did have a small stroke on the left brain 2016 with good recovery. She does not have any cardiac disease. She denies any amaurosis fugax, transient ischemic attack or new stroke symptoms.  Past Medical History:  Diagnosis Date  . Carotid artery occlusion   . Diverticulosis Sept. 2016  . GERD (gastroesophageal reflux disease)   . Hyperlipidemia   . Stroke [facility]) Oct.  18, 2016    Family History  Problem Relation Age of Onset  . Stroke Mother 72    Multiple  . Hypertension Mother   . Heart disease Mother   . Heart attack Mother   . Cancer Father     Bladder   . Heart attack Father   . Heart disease Father   . Hypertension Sister   . Heart disease Sister     After age 85-  Carotid Artery Disease    SOCIAL HISTORY: Social History  Substance Use Topics  . Smoking status: Never Smoker  . Smokeless tobacco: Never Used  . Alcohol use No    Allergies  Allergen Reactions  . Amoxicillin Diarrhea  . Cefuroxime Axetil Diarrhea    Current Outpatient Prescriptions  Medication Sig Dispense Refill  . aspirin 81 MG tablet Take 81 mg by mouth daily.     . calcium carbonate (TUMS) 500 MG chewable tablet Chew 1 tablet by mouth 2 (two) times daily.    . Calcium-Vitamin D (CALTRATE 600  PLUS-VIT D PO) Take 2 tablets by mouth daily.    . fish oil-omega-3 fatty acids 1000 MG capsule Take 1,000 mg by mouth 2 (two) times daily.     . Multiple Vitamin (MULTIVITAMIN) tablet Take 1 tablet by mouth daily.    . simvastatin (ZOCOR) 5 MG tablet Take 5 mg by mouth at bedtime.    Marland Kitchen amLODipine (NORVASC) 5 MG tablet Take 5 mg by mouth daily.    . ciprofloxacin (CIPRO) 500 MG tablet Take 1 tablet (500 mg total) by mouth 2 (two) times daily. One po bid x 10 days (Patient not taking: Reported on 05/07/2015) 20 tablet 0  . HYDROcodone-acetaminophen (NORCO/VICODIN) 5-325 MG per tablet Take 1 tablet by mouth every 6 (six) hours as needed for moderate pain or severe pain. (Patient not taking: Reported on 05/07/2015) 6 tablet 0  . metroNIDAZOLE (FLAGYL) 500 MG tablet Take 1 tablet (500 mg total) by mouth 3 (three) times daily. (Patient not taking: Reported on 05/07/2015) 30 tablet 0  . nitroGLYCERIN (NITROSTAT) 0.4 MG SL tablet Place 0.4 mg under the tongue every 5 (five) minutes as needed for chest pain (for chest pain).    . polyethylene glycol powder (GLYCOLAX/MIRALAX) powder Take 17 g by mouth daily. Until daily soft stools  OTC (Patient not taking: Reported on 05/07/2015) 250 g 0   No current facility-administered medications for this visit.     REVIEW OF  SYSTEMS:  [X]  denotes positive finding, [ ]  denotes negative finding Cardiac  Comments:  Chest pain or chest pressure:    Shortness of breath upon exertion:    Short of breath when lying flat:    Irregular heart rhythm:        Vascular    Pain in calf, thigh, or hip brought on by ambulation:    Pain in feet at night that wakes you up from your sleep:     Blood clot in your veins:    Leg swelling:           PHYSICAL EXAM: Vitals:   07/12/16 1157 07/12/16 1200  BP: 130/70 114/71  Pulse: 73   Resp: 18   Temp: 98.4 F (36.9 C)   TempSrc: Oral   SpO2: 99%   Weight: 114 lb 6.4 oz (51.9 kg)   Height: 5' 1.5" (1.562 m)      GENERAL: The patient is a well-nourished female, in no acute distress. The vital signs are documented above. CARDIOVASCULAR: Left neck incision is well healed on her carotid artery. No carotid bruits bilaterally. 2+ radial pulses bilaterally. PULMONARY: There is good air exchange  MUSCULOSKELETAL: There are no major deformities or cyanosis. NEUROLOGIC: No focal weakness or paresthesias are detected. SKIN: There are no ulcers or rashes noted. PSYCHIATRIC: The patient has a normal affect.  DATA:  Duplex today shows a lower level of stenosis and was predicted on her study 12/25/2015. Explained that this is always in a range. Her current range suggests a 40-59% stenosis in her right internal carotid artery whereas 6 months ago was felt to be at the low end of the 60-79% stenosis.Marland Kitchen.  MEDICAL ISSUES:    I explained this is reassuring that she has not had any progression. Would recommend continued yearly carotid duplex for follow-up. She will notify us a meal he should she develop any neurologic deficits.  Larina Earthlyodd F. Chanler Schreiter, MD FACS [facility] Office Tel [phone] Pager [phone]

## 2016-07-15 ENCOUNTER — Telehealth: Payer: Self-pay

## 2016-07-15 ENCOUNTER — Ambulatory Visit: Payer: Medicare Other

## 2016-07-15 NOTE — Telephone Encounter (Signed)
Phone call from pt.  Reported she was in Yoga class this morning, and performed a new exercise that required applying a strap to (L) neck, and pull downward to relieve tension in neck.   Reported after the exercise, started feeling increased pressure on the left neck region.  At present denied any swelling, tenderness, redness, or warmth.  Stated there is no discomfort at this time.  Reported no difficulty with swallowing/ breathing.  Stated "it feels better now."  Encouraged to continue to monitor for any increased swelling or discomfort.  Advised to call office if reccurance of symptoms. Instructed to go to the ER this weekend if any worsening symptoms.  Verb. Understanding.

## 2016-07-15 NOTE — Addendum Note (Signed)
Addended by: Burton ApleyPETTY, Desarea Ohagan A on: 07/15/2016 04:44 PM   Modules accepted: Orders

## 2016-08-03 ENCOUNTER — Ambulatory Visit
Admission: RE | Admit: 2016-08-03 | Discharge: 2016-08-03 | Disposition: A | Discharge status: Home / Self Care | Payer: Medicare Other | Source: Ambulatory Visit | Attending: Geriatric Medicine | Admitting: Geriatric Medicine

## 2016-08-03 DIAGNOSIS — Z1231 Encounter for screening mammogram for malignant neoplasm of breast: Secondary | ICD-10-CM

## 2017-07-18 ENCOUNTER — Encounter (HOSPITAL_COMMUNITY): Payer: Medicare Other

## 2017-07-18 ENCOUNTER — Ambulatory Visit: Payer: Medicare Other | Admitting: Family

## 2017-08-14 ENCOUNTER — Other Ambulatory Visit: Payer: Self-pay | Admitting: Geriatric Medicine

## 2017-08-14 DIAGNOSIS — Z139 Encounter for screening, unspecified: Secondary | ICD-10-CM

## 2017-08-31 ENCOUNTER — Ambulatory Visit
Admission: RE | Admit: 2017-08-31 | Discharge: 2017-08-31 | Disposition: A | Discharge status: Home / Self Care | Payer: Medicare Other | Source: Ambulatory Visit | Attending: Geriatric Medicine | Admitting: Geriatric Medicine

## 2017-08-31 DIAGNOSIS — Z139 Encounter for screening, unspecified: Secondary | ICD-10-CM

## 2018-02-15 ENCOUNTER — Other Ambulatory Visit: Payer: Self-pay

## 2018-02-15 DIAGNOSIS — I6523 Occlusion and stenosis of bilateral carotid arteries: Secondary | ICD-10-CM

## 2018-03-16 ENCOUNTER — Encounter (HOSPITAL_COMMUNITY): Payer: Medicare Other

## 2018-03-16 ENCOUNTER — Ambulatory Visit: Payer: Medicare Other | Admitting: Family

## 2018-04-17 ENCOUNTER — Ambulatory Visit: Payer: Medicare Other | Admitting: Vascular Surgery

## 2018-04-17 ENCOUNTER — Encounter: Payer: Self-pay | Admitting: Vascular Surgery

## 2018-04-17 ENCOUNTER — Other Ambulatory Visit: Payer: Self-pay

## 2018-04-17 ENCOUNTER — Ambulatory Visit (HOSPITAL_COMMUNITY)
Admission: RE | Admit: 2018-04-17 | Discharge: 2018-04-17 | Disposition: A | Discharge status: Home / Self Care | Payer: Medicare Other | Source: Ambulatory Visit | Attending: Family | Admitting: Family

## 2018-04-17 VITALS — BP 139/69 | HR 75 | Resp 18 | Ht 61.5 in | Wt 110.6 lb

## 2018-04-17 DIAGNOSIS — I6523 Occlusion and stenosis of bilateral carotid arteries: Secondary | ICD-10-CM | POA: Diagnosis not present

## 2018-04-17 NOTE — Progress Notes (Signed)
[facility] [place]  Patient name: [name] MRN: [id number] DOB: May 22, 1931 Sex: female  REASON FOR VISIT: Follow-up asymptomatic carotid disease  HPI: [name] is a 84 y.o. female here today for follow-up of asymptomatic carotid disease.  She had undergone prior endarterectomy by myself in 2005 and her left carotid.  Has known moderate stenosis in the right carotid.  She has had no new neurologic deficits.  Specifically no amaurosis fugax, transient ischemic attack or stroke.  Is had a difficult year with her husband having had multiple hospital admissions for surgical treatment of subdural hematomas.  He is here with her today and looks quite good.  Also recently had diagnosis of stage IV bladder cancer in her 87 year old son.  Past Medical History:  Diagnosis Date  . Carotid artery occlusion   . Diverticulosis Sept. 2016  . GERD (gastroesophageal reflux disease)   . Hyperlipidemia   . Stroke [facility]) Oct.  18, 2016    Family History  Problem Relation Age of Onset  . Stroke Mother 49       Multiple  . Hypertension Mother   . Heart disease Mother   . Heart attack Mother   . Cancer Father        Bladder   . Heart attack Father   . Heart disease Father   . Hypertension Sister   . Heart disease Sister        After age 14-  Carotid Artery Disease    SOCIAL HISTORY: Social History   Tobacco Use  . Smoking status: Never Smoker  . Smokeless tobacco: Never Used  Substance Use Topics  . Alcohol use: No    Allergies  Allergen Reactions  . Amoxicillin Diarrhea  . Cefuroxime Axetil Diarrhea    Current Outpatient Medications  Medication Sig Dispense Refill  . aspirin 81 MG tablet Take 81 mg by mouth daily.     . Calcium-Vitamin D (CALTRATE 600 PLUS-VIT D PO) Take 2 tablets by mouth daily.    . fish oil-omega-3 fatty acids 1000 MG capsule Take 1,000 mg by mouth 2 (two) times daily.     . Multiple  Vitamin (MULTIVITAMIN) tablet Take 1 tablet by mouth daily.    . nitroGLYCERIN (NITROSTAT) 0.4 MG SL tablet Place 0.4 mg under the tongue every 5 (five) minutes as needed for chest pain (for chest pain).    . simvastatin (ZOCOR) 5 MG tablet Take 5 mg by mouth at bedtime.    Marland Kitchen amLODipine (NORVASC) 5 MG tablet Take 5.5 mg by mouth daily.     . calcium carbonate (TUMS) 500 MG chewable tablet Chew 1 tablet by mouth 2 (two) times daily.    . ciprofloxacin (CIPRO) 500 MG tablet Take 1 tablet (500 mg total) by mouth 2 (two) times daily. One po bid x 10 days (Patient not taking: Reported on 05/07/2015) 20 tablet 0  . HYDROcodone-acetaminophen (NORCO/VICODIN) 5-325 MG per tablet Take 1 tablet by mouth every 6 (six) hours as needed for moderate pain or severe pain. (Patient not taking: Reported on 05/07/2015) 6 tablet 0  . metroNIDAZOLE (FLAGYL) 500 MG tablet Take 1 tablet (500 mg total) by mouth 3 (three) times daily. (Patient not taking: Reported on 05/07/2015) 30 tablet 0  . polyethylene glycol powder (GLYCOLAX/MIRALAX) powder Take 17 g by mouth daily. Until daily soft stools  OTC (Patient not taking: Reported on 05/07/2015) 250 g 0   No current facility-administered medications for this visit.  REVIEW OF SYSTEMS:  [X]  denotes positive finding, [ ]  denotes negative finding Cardiac  Comments:  Chest pain or chest pressure:    Shortness of breath upon exertion:    Short of breath when lying flat:    Irregular heart rhythm:        Vascular    Pain in calf, thigh, or hip brought on by ambulation:    Pain in feet at night that wakes you up from your sleep:     Blood clot in your veins:    Leg swelling:           PHYSICAL EXAM: Vitals:   04/17/18 1537 04/17/18 1538  BP: 140/65 139/69  Pulse: 75   Resp: 18   SpO2: 100%   Weight: 110 lb 9.6 oz (50.2 kg)   Height: 5' 1.5" (1.562 m)     GENERAL: The patient is a well-nourished female, in no acute distress. The vital signs are documented  above. CARDIOVASCULAR: Well-healed left carotid incision with no bruits bilaterally 2+ radial pulses bilaterally PULMONARY: There is good air exchange  MUSCULOSKELETAL: There are no major deformities or cyanosis. NEUROLOGIC: No focal weakness or paresthesias are detected. SKIN: There are no ulcers or rashes noted. PSYCHIATRIC: The patient has a normal affect.  DATA:  Widely patent left carotid endarterectomy.  Moderate 40 to 59% stenosis in right internal carotid artery  MEDICAL ISSUES: Stable follow-up.  She will continue her usual activities.  We will see her again in 1 year with repeat carotid duplex.  She knows to present immediately should she develop any neurologic deficits    Larina Earthly, MD [facility] Tel [phone] Pager [phone]

## 2018-08-28 ENCOUNTER — Other Ambulatory Visit: Payer: Self-pay | Admitting: Geriatric Medicine

## 2018-08-28 DIAGNOSIS — Z1231 Encounter for screening mammogram for malignant neoplasm of breast: Secondary | ICD-10-CM

## 2018-09-21 ENCOUNTER — Ambulatory Visit: Payer: Medicare Other

## 2018-11-06 ENCOUNTER — Ambulatory Visit: Payer: Medicare Other

## 2018-12-18 ENCOUNTER — Ambulatory Visit
Admission: RE | Admit: 2018-12-18 | Discharge: 2018-12-18 | Disposition: A | Discharge status: Home / Self Care | Payer: Medicare Other | Source: Ambulatory Visit | Attending: Geriatric Medicine | Admitting: Geriatric Medicine

## 2018-12-18 ENCOUNTER — Other Ambulatory Visit: Payer: Self-pay

## 2018-12-18 DIAGNOSIS — Z1231 Encounter for screening mammogram for malignant neoplasm of breast: Secondary | ICD-10-CM

## 2019-11-29 ENCOUNTER — Other Ambulatory Visit: Payer: Self-pay | Admitting: Geriatric Medicine

## 2019-11-29 DIAGNOSIS — Z1231 Encounter for screening mammogram for malignant neoplasm of breast: Secondary | ICD-10-CM

## 2019-12-25 DIAGNOSIS — H18523 Epithelial (juvenile) corneal dystrophy, bilateral: Secondary | ICD-10-CM | POA: Insufficient documentation

## 2019-12-26 ENCOUNTER — Ambulatory Visit
Admission: RE | Admit: 2019-12-26 | Discharge: 2019-12-26 | Disposition: A | Discharge status: Home / Self Care | Payer: Medicare Other | Source: Ambulatory Visit | Attending: Geriatric Medicine | Admitting: Geriatric Medicine

## 2019-12-26 ENCOUNTER — Other Ambulatory Visit: Payer: Self-pay

## 2019-12-26 DIAGNOSIS — Z1231 Encounter for screening mammogram for malignant neoplasm of breast: Secondary | ICD-10-CM

## 2020-01-08 ENCOUNTER — Other Ambulatory Visit: Payer: Self-pay

## 2020-01-08 ENCOUNTER — Ambulatory Visit: Payer: Medicare Other | Admitting: Podiatry

## 2020-01-08 ENCOUNTER — Other Ambulatory Visit: Payer: Self-pay | Admitting: Podiatry

## 2020-01-08 ENCOUNTER — Encounter: Payer: Self-pay | Admitting: Podiatry

## 2020-01-08 ENCOUNTER — Ambulatory Visit (INDEPENDENT_AMBULATORY_CARE_PROVIDER_SITE_OTHER): Payer: Medicare Other

## 2020-01-08 VITALS — Temp 97.3°F

## 2020-01-08 DIAGNOSIS — M79671 Pain in right foot: Secondary | ICD-10-CM | POA: Diagnosis not present

## 2020-01-08 DIAGNOSIS — M79672 Pain in left foot: Secondary | ICD-10-CM

## 2020-01-08 DIAGNOSIS — M722 Plantar fascial fibromatosis: Secondary | ICD-10-CM

## 2020-01-08 DIAGNOSIS — L84 Corns and callosities: Secondary | ICD-10-CM | POA: Diagnosis not present

## 2020-01-08 NOTE — Patient Instructions (Signed)

## 2020-01-09 ENCOUNTER — Other Ambulatory Visit: Payer: Self-pay

## 2020-01-09 DIAGNOSIS — I6523 Occlusion and stenosis of bilateral carotid arteries: Secondary | ICD-10-CM

## 2020-01-09 NOTE — Progress Notes (Signed)
Subjective:   Patient ID: [name], female   DOB: 86 y.o.   MRN: [id number]   HPI Patient presents stating she has developed a lot of pain in her heels of both feet over the last 6 months and does not know what happened.  She is tried over-the-counter insoles and shoe gear modifications and also complains of chronic lesions on both feet which are sore.  Patient does not smoke likes to be active and presents with caregiver   Review of Systems  All other systems reviewed and are negative.       Objective:  Physical Exam Vitals and nursing note reviewed.  Constitutional:      Appearance: She is well-developed.  Pulmonary:     Effort: Pulmonary effort is normal.  Musculoskeletal:        General: Normal range of motion.  Skin:    General: Skin is warm.  Neurological:     Mental Status: She is alert.     Neurovascular status found to be moderately diminished but intact with patient found to have high arch foot with exquisite discomfort in the plantar fascial bilateral at the insertion to the calcaneus.  Patient is noted to have keratotic lesion subone subfive metatarsals of both feet which are very tender when pressed and has good digital perfusion and she is well oriented at this time     Assessment:  Acute plantar fasciitis bilateral along with lesion formation bilateral that are very painful when palpated     Plan:  H&P reviewed all conditions and x-rays.  At this time I did do sterile prep injected the plantar fascial bilateral 3 mg Kenalog 5 mg Xylocaine and debrided lesions bilateral with no iatrogenic bleeding and this will be repeated as needed  X-rays indicate that there is no spur formation but there is a high arch foot structure moderate arthritis osteoporosis bilateral

## 2020-01-21 ENCOUNTER — Other Ambulatory Visit: Payer: Self-pay

## 2020-01-21 ENCOUNTER — Ambulatory Visit (HOSPITAL_COMMUNITY)
Admission: RE | Admit: 2020-01-21 | Discharge: 2020-01-21 | Disposition: A | Discharge status: Home / Self Care | Payer: Medicare Other | Source: Ambulatory Visit | Attending: Surgery | Admitting: Surgery

## 2020-01-21 ENCOUNTER — Ambulatory Visit (INDEPENDENT_AMBULATORY_CARE_PROVIDER_SITE_OTHER): Payer: Medicare Other | Admitting: Physician Assistant

## 2020-01-21 VITALS — BP 125/61 | HR 83 | Temp 97.7°F | Resp 20 | Ht 61.5 in | Wt 109.4 lb

## 2020-01-21 DIAGNOSIS — I6523 Occlusion and stenosis of bilateral carotid arteries: Secondary | ICD-10-CM | POA: Diagnosis present

## 2020-01-21 NOTE — Progress Notes (Signed)
Office Note     CC:  follow up Requesting Provider:  Merlene Laughter, MD  HPI: [name] is a 86 y.o. (April 25, 1931) female who presents for routine follow-up of asymptomatic carotid artery disease.  She is status post left carotid endarterectomy by Dr. Arbie Cookey in 2005.  She has a known moderate stenosis in the right carotid artery. She denies amaurosis fugax, transient ischemic attack, extremity weakness/paralysis, slurred speech.   She is compliant with aspirin 81 mg and simvastatin daily. Past Medical History:  Diagnosis Date  . Carotid artery occlusion   . Diverticulosis Sept. 2016  . GERD (gastroesophageal reflux disease)   . Hyperlipidemia   . Stroke [facility]) Oct.  18, 2016    Past Surgical History:  Procedure Laterality Date  . CAROTID ENDARTERECTOMY Left Nov. 2005   Left carotid  . EYE SURGERY  June 27,2013   Right eye- Cataract  . EYE SURGERY  July 11,2013   Left eye - Cataract  . TONSILLECTOMY      Social History   Socioeconomic History  . Marital status: Married    Spouse name: Not on file  . Number of children: Not on file  . Years of education: Not on file  . Highest education level: Not on file  Occupational History  . Not on file  Tobacco Use  . Smoking status: Never Smoker  . Smokeless tobacco: Never Used  Substance and Sexual Activity  . Alcohol use: No  . Drug use: No  . Sexual activity: Not on file  Other Topics Concern  . Not on file  Social History Narrative  . Not on file   Social Determinants of Health   Financial Resource Strain:   . Difficulty of Paying Living Expenses:   Food Insecurity:   . Worried About Programme researcher, broadcasting/film/video in the Last Year:   . Barista in the Last Year:   Transportation Needs:   . Freight forwarder (Medical):   Marland Kitchen Lack of Transportation (Non-Medical):   Physical Activity:   . Days of Exercise per Week:   . Minutes of Exercise per Session:   Stress:   . Feeling of Stress :   Social  Connections:   . Frequency of Communication with Friends and Family:   . Frequency of Social Gatherings with Friends and Family:   . Attends Religious Services:   . Active Member of Clubs or Organizations:   . Attends Banker Meetings:   Marland Kitchen Marital Status:   Intimate Partner Violence:   . Fear of Current or Ex-Partner:   . Emotionally Abused:   Marland Kitchen Physically Abused:   . Sexually Abused:    Family History  Problem Relation Age of Onset  . Stroke Mother 26       Multiple  . Hypertension Mother   . Heart disease Mother   . Heart attack Mother   . Cancer Father        Bladder   . Heart attack Father   . Heart disease Father   . Hypertension Sister   . Heart disease Sister        After age 61-  Carotid Artery Disease    Current Outpatient Medications  Medication Sig Dispense Refill  . amLODipine (NORVASC) 5 MG tablet Take 5.5 mg by mouth daily.     Marland Kitchen aspirin 81 MG tablet Take 81 mg by mouth daily.     . calcium carbonate (TUMS) 500 MG chewable tablet Chew 1  tablet by mouth 2 (two) times daily.    . Calcium-Vitamin D (CALTRATE 600 PLUS-VIT D PO) Take 2 tablets by mouth daily.    . ferrous sulfate 325 (65 FE) MG tablet Take by mouth.    . fish oil-omega-3 fatty acids 1000 MG capsule Take 1,000 mg by mouth 2 (two) times daily.     . Multiple Vitamin (MULTIVITAMIN) tablet Take 1 tablet by mouth daily.    . nitroGLYCERIN (NITROSTAT) 0.4 MG SL tablet Place 0.4 mg under the tongue every 5 (five) minutes as needed for chest pain (for chest pain).    . simvastatin (ZOCOR) 5 MG tablet Take 5 mg by mouth at bedtime.     No current facility-administered medications for this visit.    Allergies  Allergen Reactions  . Amoxicillin Diarrhea  . Cefuroxime Axetil Diarrhea     REVIEW OF SYSTEMS:   [X]  denotes positive finding, [ ]  denotes negative finding Cardiac  Comments:  Chest pain or chest pressure:    Shortness of breath upon exertion:    Short of breath when lying  flat:    Irregular heart rhythm:        Vascular    Pain in calf, thigh, or hip brought on by ambulation:    Pain in feet at night that wakes you up from your sleep:     Blood clot in your veins:    Leg swelling:         Pulmonary    Oxygen at home:    Productive cough:     Wheezing:         Neurologic    Sudden weakness in arms or legs:     Sudden numbness in arms or legs:     Sudden onset of difficulty speaking or slurred speech:    Temporary loss of vision in one eye:     Problems with dizziness:         Gastrointestinal    Blood in stool:     Vomited blood:         Genitourinary    Burning when urinating:     Blood in urine:        Psychiatric    Major depression:         Hematologic    Bleeding problems:    Problems with blood clotting too easily:        Skin    Rashes or ulcers:        Constitutional    Fever or chills:      PHYSICAL EXAMINATION:  Vitals:   01/21/20 1441 01/21/20 1445  BP: 131/63 125/61  Pulse: 83   Resp: 20   Temp: 97.7 F (36.5 C)   TempSrc: Temporal   SpO2: 98%   Weight: 109 lb 6.4 oz (49.6 kg)   Height: 5' 1.5" (1.562 m)     General:  WDWN in NAD; vital signs documented above Gait: Not observed HENT: WNL, normocephalic Pulmonary: normal non-labored breathing , without Rales, rhonchi,  wheezing Cardiac: regular HR, without  Murmurs with right carotid bruit Abdomen: soft, NT, no masses Skin: without rashes Vascular Exam/Pulses: 2+ radial pulses. Feet warm and well perfused without skin changes Extremities: without ischemic changes, without Gangrene , without cellulitis; without open wounds;  Musculoskeletal: no muscle wasting or atrophy  Neurologic: A&O X 3;  No focal weakness or paresthesias are detected Psychiatric:  The pt has Normal affect.   Non-Invasive Vascular Imaging:   01/21/2020 Right Carotid:  Velocities in the right ICA are consistent with a 40-59% stenosis.The ECA appears >50% stenosed.   Left Carotid:  There is no evidence of stenosis in the left ICA.   Vertebrals: Bilateral vertebral arteries demonstrate antegrade flow.  Subclavians: Normal flow hemodynamics were seen in bilateral subclavian arteries.     ASSESSMENT/PLAN:: 86 y.o. female here for follow up for bilateral carotid artery stenosis status post left CEA.  She remains asymptomatic.  Her duplex of bilateral carotid arteries is unchanged as compared to 2019.  We reviewed the signs and symptoms of TIA/stroke and she understands to call for any of these symptoms regardless of length of symptoms.  Continue aspirin and statin therapy. Follow-up in 1 year with bilateral carotid duplex.    Milinda Antis, PA-C [facility] [phone]  Clinic MD:   Early

## 2020-10-12 DIAGNOSIS — Z0000 Encounter for general adult medical examination without abnormal findings: Secondary | ICD-10-CM | POA: Diagnosis not present

## 2020-10-12 DIAGNOSIS — M25551 Pain in right hip: Secondary | ICD-10-CM | POA: Diagnosis not present

## 2020-10-12 DIAGNOSIS — R251 Tremor, unspecified: Secondary | ICD-10-CM | POA: Diagnosis not present

## 2020-10-12 DIAGNOSIS — Z79899 Other long term (current) drug therapy: Secondary | ICD-10-CM | POA: Diagnosis not present

## 2020-10-12 DIAGNOSIS — K5901 Slow transit constipation: Secondary | ICD-10-CM | POA: Diagnosis not present

## 2020-10-12 DIAGNOSIS — M8580 Other specified disorders of bone density and structure, unspecified site: Secondary | ICD-10-CM | POA: Diagnosis not present

## 2020-10-12 DIAGNOSIS — I700 Atherosclerosis of aorta: Secondary | ICD-10-CM | POA: Diagnosis not present

## 2020-10-12 DIAGNOSIS — E7800 Pure hypercholesterolemia, unspecified: Secondary | ICD-10-CM | POA: Diagnosis not present

## 2020-10-12 DIAGNOSIS — Z780 Asymptomatic menopausal state: Secondary | ICD-10-CM | POA: Diagnosis not present

## 2020-10-12 DIAGNOSIS — R413 Other amnesia: Secondary | ICD-10-CM | POA: Diagnosis not present

## 2020-10-12 DIAGNOSIS — L84 Corns and callosities: Secondary | ICD-10-CM | POA: Diagnosis not present

## 2020-10-12 DIAGNOSIS — I10 Essential (primary) hypertension: Secondary | ICD-10-CM | POA: Diagnosis not present

## 2020-10-14 ENCOUNTER — Other Ambulatory Visit: Payer: Self-pay | Admitting: Geriatric Medicine

## 2020-10-14 DIAGNOSIS — M8580 Other specified disorders of bone density and structure, unspecified site: Secondary | ICD-10-CM

## 2020-10-14 DIAGNOSIS — Z780 Asymptomatic menopausal state: Secondary | ICD-10-CM

## 2020-11-05 ENCOUNTER — Other Ambulatory Visit: Payer: Self-pay

## 2020-11-05 DIAGNOSIS — I6523 Occlusion and stenosis of bilateral carotid arteries: Secondary | ICD-10-CM

## 2020-12-29 ENCOUNTER — Other Ambulatory Visit: Payer: Self-pay | Admitting: Geriatric Medicine

## 2020-12-29 DIAGNOSIS — Z1231 Encounter for screening mammogram for malignant neoplasm of breast: Secondary | ICD-10-CM

## 2021-01-08 ENCOUNTER — Other Ambulatory Visit: Payer: Self-pay

## 2021-01-08 ENCOUNTER — Ambulatory Visit
Admission: RE | Admit: 2021-01-08 | Discharge: 2021-01-08 | Disposition: A | Discharge status: Home / Self Care | Payer: Medicare Other | Source: Ambulatory Visit | Attending: Geriatric Medicine | Admitting: Geriatric Medicine

## 2021-01-08 DIAGNOSIS — Z780 Asymptomatic menopausal state: Secondary | ICD-10-CM | POA: Diagnosis not present

## 2021-01-08 DIAGNOSIS — M8580 Other specified disorders of bone density and structure, unspecified site: Secondary | ICD-10-CM

## 2021-01-08 DIAGNOSIS — M8588 Other specified disorders of bone density and structure, other site: Secondary | ICD-10-CM | POA: Diagnosis not present

## 2021-02-03 DIAGNOSIS — M25511 Pain in right shoulder: Secondary | ICD-10-CM | POA: Diagnosis not present

## 2021-02-15 DIAGNOSIS — S92424A Nondisplaced fracture of distal phalanx of right great toe, initial encounter for closed fracture: Secondary | ICD-10-CM | POA: Diagnosis not present

## 2021-02-19 ENCOUNTER — Ambulatory Visit
Admission: RE | Admit: 2021-02-19 | Discharge: 2021-02-19 | Disposition: A | Discharge status: Home / Self Care | Payer: Medicare Other | Source: Ambulatory Visit | Attending: Geriatric Medicine | Admitting: Geriatric Medicine

## 2021-02-19 ENCOUNTER — Other Ambulatory Visit: Payer: Self-pay

## 2021-02-19 DIAGNOSIS — Z1231 Encounter for screening mammogram for malignant neoplasm of breast: Secondary | ICD-10-CM

## 2021-03-01 DIAGNOSIS — S92424D Nondisplaced fracture of distal phalanx of right great toe, subsequent encounter for fracture with routine healing: Secondary | ICD-10-CM | POA: Diagnosis not present

## 2021-04-12 DIAGNOSIS — D508 Other iron deficiency anemias: Secondary | ICD-10-CM | POA: Diagnosis not present

## 2021-04-12 DIAGNOSIS — E7800 Pure hypercholesterolemia, unspecified: Secondary | ICD-10-CM | POA: Diagnosis not present

## 2021-04-12 DIAGNOSIS — I10 Essential (primary) hypertension: Secondary | ICD-10-CM | POA: Diagnosis not present

## 2021-09-08 ENCOUNTER — Telehealth: Payer: Self-pay | Admitting: *Deleted

## 2021-09-08 NOTE — Telephone Encounter (Signed)
Patient is calling to schedule an upcoming appointment for possible ingrown with Dr Ralene Cork, please contact. ?

## 2021-10-20 ENCOUNTER — Ambulatory Visit: Payer: Medicare Other | Admitting: Podiatry

## 2021-12-15 ENCOUNTER — Emergency Department (HOSPITAL_COMMUNITY): Payer: Medicare Other

## 2021-12-15 ENCOUNTER — Emergency Department (HOSPITAL_COMMUNITY)
Admission: EM | Admit: 2021-12-15 | Discharge: 2021-12-15 | Disposition: A | Discharge status: Home / Self Care | Payer: Medicare Other | Attending: Emergency Medicine | Admitting: Emergency Medicine

## 2021-12-15 ENCOUNTER — Other Ambulatory Visit: Payer: Self-pay

## 2021-12-15 ENCOUNTER — Encounter (HOSPITAL_COMMUNITY): Payer: Self-pay

## 2021-12-15 DIAGNOSIS — Z7982 Long term (current) use of aspirin: Secondary | ICD-10-CM | POA: Diagnosis not present

## 2021-12-15 DIAGNOSIS — R079 Chest pain, unspecified: Secondary | ICD-10-CM

## 2021-12-15 DIAGNOSIS — K449 Diaphragmatic hernia without obstruction or gangrene: Secondary | ICD-10-CM | POA: Insufficient documentation

## 2021-12-15 DIAGNOSIS — R072 Precordial pain: Secondary | ICD-10-CM | POA: Diagnosis present

## 2021-12-15 LAB — BASIC METABOLIC PANEL WITH GFR
Anion gap: 8 (ref 5–15)
BUN: 19 mg/dL (ref 8–23)
CO2: 25 mmol/L (ref 22–32)
Calcium: 9.1 mg/dL (ref 8.9–10.3)
Chloride: 107 mmol/L (ref 98–111)
Creatinine, Ser: 0.83 mg/dL (ref 0.44–1.00)
GFR, Estimated: >60 mL/min
Glucose, Bld: 99 mg/dL (ref 70–99)
Potassium: 4 mmol/L (ref 3.5–5.1)
Sodium: 140 mmol/L (ref 135–145)

## 2021-12-15 LAB — CBC
HCT: 37.3 % (ref 36.0–46.0)
Hemoglobin: 11.8 g/dL — ABNORMAL LOW (ref 12.0–15.0)
MCH: 29.6 pg (ref 26.0–34.0)
MCHC: 31.6 g/dL (ref 30.0–36.0)
MCV: 93.7 fL (ref 80.0–100.0)
Platelets: 269 10*3/uL (ref 150–400)
RBC: 3.98 MIL/uL (ref 3.87–5.11)
RDW: 13.2 % (ref 11.5–15.5)
WBC: 6.9 10*3/uL (ref 4.0–10.5)
nRBC: 0 % (ref 0.0–0.2)

## 2021-12-15 LAB — HEPATIC FUNCTION PANEL
ALT: 17 U/L (ref 0–44)
AST: 26 U/L (ref 15–41)
Albumin: 3.5 g/dL (ref 3.5–5.0)
Alkaline Phosphatase: 53 U/L (ref 38–126)
Bilirubin, Direct: <0.1 mg/dL (ref 0.0–0.2)
Total Bilirubin: 0.4 mg/dL (ref 0.3–1.2)
Total Protein: 6.9 g/dL (ref 6.5–8.1)

## 2021-12-15 LAB — LIPASE, BLOOD: Lipase: 43 U/L (ref 11–51)

## 2021-12-15 LAB — TROPONIN I (HIGH SENSITIVITY)
Troponin I (High Sensitivity): 5 ng/L
Troponin I (High Sensitivity): 6 ng/L

## 2021-12-15 LAB — D-DIMER, QUANTITATIVE: D-Dimer, Quant: 0.55 ug{FEU}/mL — ABNORMAL HIGH (ref 0.00–0.50)

## 2021-12-15 MED ORDER — IOHEXOL 350 MG/ML SOLN
100.0000 mL | Freq: Once | INTRAVENOUS | Status: AC | PRN
  Administered 2021-12-15: 100 mL via INTRAVENOUS

## 2021-12-15 MED ORDER — OMEPRAZOLE 20 MG PO CPDR: 20.0000 mg | DELAYED_RELEASE_CAPSULE | Freq: Every day | ORAL | 0 refills | Status: DC

## 2021-12-15 MED ORDER — SUCRALFATE 1 G PO TABS: 1.0000 g | ORAL_TABLET | Freq: Three times a day (TID) | ORAL | 0 refills | Status: DC

## 2021-12-15 NOTE — ED Provider Notes (Signed)
[facility] EMERGENCY DEPARTMENT Provider Note   CSN: [id number] Arrival date & time: 12/15/21  8242     History  Chief Complaint  Patient presents with   Chest Pain    [name] is a 88 y.o. female.  The history is provided by medical records and the patient. No language interpreter was used.  Chest Pain Pain location:  Substernal area, L chest and R chest Pain quality: dull, pressure and tightness   Pain severity:  Severe Onset quality:  Sudden Duration:  10 minutes Timing:  Constant Progression:  Resolved Chronicity:  Recurrent Relieved by:  Nothing Worsened by:  Deep breathing Ineffective treatments:  None tried Associated symptoms: no abdominal pain, no altered mental status, no back pain, no claudication, no cough, no fatigue, no fever, no headache, no lower extremity edema, no nausea, no numbness, no palpitations, no shortness of breath, no syncope, no vomiting and no weakness        Home Medications Prior to Admission medications   Medication Sig Start Date End Date Taking? Authorizing Provider  amLODipine (NORVASC) 5 MG tablet Take 5.5 mg by mouth daily.  04/09/15 01/21/20  [provider]  aspirin 81 MG tablet Take 81 mg by mouth daily.     [provider]  calcium carbonate (TUMS) 500 MG chewable tablet Chew 1 tablet by mouth 2 (two) times daily.    [provider]  Calcium-Vitamin D (CALTRATE 600 PLUS-VIT D PO) Take 2 tablets by mouth daily.    [provider]  ferrous sulfate 325 (65 FE) MG tablet Take by mouth.    [provider]  fish oil-omega-3 fatty acids 1000 MG capsule Take 1,000 mg by mouth 2 (two) times daily.     [provider]  Multiple Vitamin (MULTIVITAMIN) tablet Take 1 tablet by mouth daily.    [provider]  nitroGLYCERIN (NITROSTAT) 0.4 MG SL tablet Place 0.4 mg under the tongue every 5 (five) minutes as needed for chest pain (for chest pain).     [provider]  simvastatin (ZOCOR) 5 MG tablet Take 5 mg by mouth at bedtime.    [provider]      Allergies    Amoxicillin and Cefuroxime axetil    Review of Systems   Review of Systems  Constitutional:  Negative for chills, fatigue and fever.  HENT:  Negative for congestion.   Eyes:  Negative for visual disturbance.  Respiratory:  Positive for chest tightness. Negative for cough, shortness of breath and wheezing.   Cardiovascular:  Positive for chest pain. Negative for palpitations, claudication and syncope.  Gastrointestinal:  Negative for abdominal pain, constipation, diarrhea, nausea and vomiting.  Genitourinary:  Negative for dysuria, flank pain and frequency.  Musculoskeletal:  Negative for back pain, neck pain and neck stiffness.  Skin:  Negative for rash and wound.  Neurological:  Negative for weakness, light-headedness, numbness and headaches.  Psychiatric/Behavioral:  Negative for agitation.   All other systems reviewed and are negative.   Physical Exam Updated Vital Signs BP 133/60 (BP Location: Right Arm)   Pulse 78   Temp 97.9 F (36.6 C) (Oral)   Resp 18   Ht 5\' 2"  (1.575 m)   Wt 48.2 kg   SpO2 97%   BMI 19.42 kg/m  Physical Exam Vitals and nursing note reviewed.  Constitutional:      General: She is not in acute distress.    Appearance: She is well-developed. She is not ill-appearing,  toxic-appearing or diaphoretic.  HENT:     Head: Normocephalic and atraumatic.  Eyes:     Extraocular Movements: Extraocular movements intact.     Conjunctiva/sclera: Conjunctivae normal.     Pupils: Pupils are equal, round, and reactive to light.  Cardiovascular:     Rate and Rhythm: Normal rate and regular rhythm.     Heart sounds: No murmur heard. Pulmonary:     Effort: Pulmonary effort is normal. No respiratory distress.     Breath sounds: Normal breath sounds. No decreased breath sounds, wheezing, rhonchi or rales.  Chest:     Chest wall:  No tenderness.  Abdominal:     Palpations: Abdomen is soft.     Tenderness: There is no abdominal tenderness.  Musculoskeletal:        General: No swelling.     Cervical back: Neck supple.     Right lower leg: No tenderness. No edema.     Left lower leg: No tenderness. No edema.  Skin:    General: Skin is warm and dry.     Capillary Refill: Capillary refill takes less than 2 seconds.     Findings: No erythema or rash.  Neurological:     General: No focal deficit present.     Mental Status: She is alert.  Psychiatric:        Mood and Affect: Mood normal.     ED Results / Procedures / Treatments   Labs (all labs ordered are listed, but only abnormal results are displayed) Labs Reviewed  CBC - Abnormal; Notable for the following components:      Result Value   Hemoglobin 11.8 (*)    All other components within normal limits  D-DIMER, QUANTITATIVE - Abnormal; Notable for the following components:   D-Dimer, Quant 0.55 (*)    All other components within normal limits  BASIC METABOLIC PANEL  HEPATIC FUNCTION PANEL  LIPASE, BLOOD  TROPONIN I (HIGH SENSITIVITY)  TROPONIN I (HIGH SENSITIVITY)    EKG EKG Interpretation  Date/Time:  Wednesday December 15 2021 08:35:23 EDT Ventricular Rate:  75 PR Interval:  151 QRS Duration: 93 QT Interval:  394 QTC Calculation: 441 R Axis:   7 Text Interpretation: Sinus rhythm Low voltage, precordial leads when compared to prior, similar appearance. No STEMI Confirmed by Theda Belfast ([id number]) on 12/15/2021 8:48:48 AM  Radiology CT Angio Chest PE W and/or Wo Contrast  Result Date: 12/15/2021 CLINICAL DATA:  Pulmonary embolism (PE) suspected, positive D-dimer EXAM: CT ANGIOGRAPHY CHEST WITH CONTRAST TECHNIQUE: Multidetector CT imaging of the chest was performed using the standard protocol during bolus administration of intravenous contrast. Multiplanar CT image reconstructions and MIPs were obtained to evaluate the vascular anatomy. RADIATION  DOSE REDUCTION: This exam was performed according to the departmental dose-optimization program which includes automated exposure control, adjustment of the mA and/or kV according to patient size and/or use of iterative reconstruction technique. CONTRAST:  OMNIPAQUE IOHEXOL 350 MG/ML SOLN COMPARISON:  Same day chest x-ray FINDINGS: Cardiovascular: Satisfactory opacification of the pulmonary arteries to the segmental level. No evidence of pulmonary embolism. Thoracic aorta is nonaneurysmal. Atherosclerotic calcifications of the aorta and coronary arteries. Normal heart size. No pericardial effusion. Mediastinum/Nodes: No enlarged mediastinal, hilar, or axillary lymph nodes. Small calcified mediastinal and bilateral hilar lymph nodes. Thyroid gland, trachea, and esophagus demonstrate no significant findings. Lungs/Pleura: Band-like areas of bibasilar scarring or atelectasis. Biapical pleuroparenchymal scarring. Lungs are otherwise clear. No pleural effusion or pneumothorax. Upper Abdomen: Small hiatal hernia. Musculoskeletal: Mildly  exaggerated thoracic kyphosis. No acute bony abnormality. No chest wall abnormality. Review of the MIP images confirms the above findings. IMPRESSION: 1. No evidence of pulmonary embolism or other acute intrathoracic findings. 2. Small hiatal hernia. 3. Aortic and coronary artery atherosclerosis (ICD10-I70.0). Electronically Signed   By: Duanne Guess D.O.   On: 12/15/2021 14:25   DG Chest 2 View  Result Date: 12/15/2021 CLINICAL DATA:  Chest pain EXAM: CHEST - 2 VIEW COMPARISON:  08/24/2011 FINDINGS: The heart size and mediastinal contours are within normal limits. Both lungs are clear. The visualized skeletal structures are unremarkable. IMPRESSION: No active cardiopulmonary disease. Electronically Signed   By: Elige Ko M.D.   On: 12/15/2021 09:28    Procedures Procedures    Medications Ordered in ED Medications  iohexol (OMNIPAQUE) 350 MG/ML injection 100 mL  (100 mLs Intravenous Contrast Given 12/15/21 1417)    ED Course/ Medical Decision Making/ A&P                           Medical Decision Making Amount and/or Complexity of Data Reviewed Labs: ordered. Radiology: ordered.  Risk Prescription drug management.    Shalon Salado is a 88 y.o. female with a past medical history significant for previous stroke, carotid disease status post endarterectomy in 2005, hyperlipidemia, and GERD who presents with chest pain.  Patient reports that yesterday morning when she got up to go to the bathroom, she had a bandlike pressure across her chest.  Only lasted several minutes and then resolved.  She reports he had no symptoms throughout the day and thought it would return.  She reports this morning again she woke up to go to the bathroom and had this bandlike chest pain across her chest wrapping around towards the back that was moderate to severe.  She reports it felt difficult to get in a deep breath but denied overt shortness of breath.  She denies any nausea or vomiting but it was persistent for just over 10 minutes.  She denied any bowel changes, urinary changes, or trauma.  Denies any rashes.  Denies any history of this aside from yesterday morning.  Denies any symptoms currently and got better on its own.  She does say that she had some spareribs last night but does not think it felt like her previous reflux.  Reports the pain was moderate to severe.  On exam, lungs were clear.  Chest was nontender.  Abdomen was nontender.  Good pulses in extremities.  Legs nontender and nonedematous.  Patient well-appearing.  No rashes seen.  Normal bowel sounds.  Did not appreciate a murmur.  Did not appreciate carotid bruit on my exam.  Patient otherwise well-appearing with normal vital signs.  EKG did not show STEMI.  Given the patient's description of symptoms starting laying flat and then improving after being upright for a period of time with her history  of GERD and eating ribs last night I do consider this could be reflux as the etiology however with her age and comorbidities a cardiac or pulmonary cause are also considered.  Given her pain being bandlike and pressure and tightness around her chest, we felt it was reasonable to get labs including delta troponin, D-dimer, and chest x-ray.  We will keep her on the monitor.  Anticipate shared decision-making conversation after work-up to determine disposition.  Patient's D-dimer was positive.  Initial troponin was negative.  We will get a CT PE study.  Patient  is remained stable here for several hours.  If delta troponin is negative and PE study is negative, I suspect this is more related to GERD and reflux in the setting of her spareribs last night.  Anticipate reassessment after images and work-up.  PE study was negative.  Did show small hiatal hernia.  Unclear if this is contributing to her symptoms however I do suspect she has degree of reflux given it being in the morning laying flat and the spicy food with ribs she ate last night.  Patient be given printed for Prilosec and Carafate and will follow-up with both GI and cardiology and PCP.  She agreed with plan of care and was feeling better.  She proved ability for over 6 and half hours without any recurrent chest discomfort.  We feel she is safe for discharge home.  Patient agrees and was discharged in good condition.        Final Clinical Impression(s) / ED Diagnoses Final diagnoses:  Intermittent chest pain  Hiatal hernia    Rx / DC Orders ED Discharge Orders          Ordered    omeprazole (PRILOSEC) 20 MG capsule  Daily        12/15/21 1519    sucralfate (CARAFATE) 1 g tablet  3 times daily with meals & bedtime        12/15/21 1519    Ambulatory referral to Cardiology        12/15/21 1520           Clinical Impression: 1. Intermittent chest pain   2. Hiatal hernia     Disposition: Discharge  Condition: Good  I  have discussed the results, Dx and Tx plan with the pt(& family if present). He/she/they expressed understanding and agree(s) with the plan. Discharge instructions discussed at great length. Strict return precautions discussed and pt &/or family have verbalized understanding of the instructions. No further questions at time of discharge.    New Prescriptions   OMEPRAZOLE (PRILOSEC) 20 MG CAPSULE    Take 1 capsule (20 mg total) by mouth daily.   SUCRALFATE (CARAFATE) 1 G TABLET    Take 1 tablet (1 g total) by mouth 4 (four) times daily -  with meals and at bedtime.    Follow Up: Merlene Laughter, MD [address] [place] [address] [phone]     [facility] CARDIOVASCULAR DIVISION [address] [place] [address] [phone]    [facility] EMERGENCY DEPARTMENT [address] [id number] mc [place] [address] [phone]    Gastroenterology, Deboraha Sprang [address] [place] [address] [place] [address] [phone]         Danilynn Jemison, Canary Brim, MD 12/15/21 [phone]

## 2021-12-15 NOTE — ED Triage Notes (Signed)
Had an episode yesterday morning waking her up from sleep felt like a band around her chest and back lasted 5 mins and today the same thing just a little worse. 7/10 denies N/V had 324 asa

## 2021-12-15 NOTE — ED Notes (Signed)
Walked patient to the bathroom patient did well 

## 2021-12-15 NOTE — ED Notes (Signed)
Pt is being PO challenged at this time

## 2021-12-15 NOTE — ED Notes (Signed)
Got patient into a gown on the monitor did ekg shown to Dr Rush Landmark got patient some warm blankets patient is resting with call bell in reach

## 2021-12-15 NOTE — Discharge Instructions (Signed)
Your history, exam and work-up today did not show evidence of a cardiac or lung cause of your symptoms.  Your CT scan to rule out blood clot was negative for clots.  I did see a small hiatal hernia which we discussed so please follow-up with GI and your primary doctor for this.  Please also consider follow-up with cardiology given your chest pain however your troponin and other work-up was reassuring from a cardiac standpoint.  Please rest and stay hydrated and use the medicine to help with stomach acid.  Please avoid spicy foods.  If any symptoms change or worsen acutely, please return to the nearest emergency department.

## 2022-10-24 ENCOUNTER — Ambulatory Visit: Payer: Medicare Other | Admitting: Physician Assistant

## 2022-10-24 ENCOUNTER — Encounter: Payer: Self-pay | Admitting: Physician Assistant

## 2022-10-24 ENCOUNTER — Other Ambulatory Visit (INDEPENDENT_AMBULATORY_CARE_PROVIDER_SITE_OTHER): Payer: Medicare Other

## 2022-10-24 ENCOUNTER — Ambulatory Visit: Payer: Medicare Other

## 2022-10-24 VITALS — BP 115/54 | HR 84 | Resp 20 | Wt 101.0 lb

## 2022-10-24 DIAGNOSIS — R413 Other amnesia: Secondary | ICD-10-CM

## 2022-10-24 LAB — VITAMIN B12: Vitamin B-12: 259 pg/mL (ref 211–911)

## 2022-10-24 LAB — TSH: TSH: 2.91 u[IU]/mL (ref 0.35–5.50)

## 2022-10-24 NOTE — Progress Notes (Signed)
Assessment/Plan:    The patient is seen in neurologic consultation at the request of Schwartzman, Loretha Brasil, MD for the evaluation of memory.  [name] is a very pleasant 89 y.o. year old RH female with a history of hypertension, hyperlipidemia, h/o CVA with aphasia in 2016, history of carotid artery stenosis,  ET R hand seen today for evaluation of memory loss. MoCA today is 25/30. She is able to perform her ADLs and to continue to drive without difficulties. Workup is in progress.    Memory Impairment  MRI brain without contrast to assess for underlying structural abnormality and assess vascular load  Neurocognitive testing to further evaluate cognitive concerns and determine other underlying cause of memory changes, including potential contribution from sleep, anxiety, or depression  Recommend grief counseling  Check B12, TSH Folllow up in 1 month   Subjective:    The patient is accompanied by a friend who supplements the history.    How long did patient have memory difficulties? For several years,  but after her husband died "it may have worsened". Patient has some difficulty remembering recent conversations and people names. She has issues remembering appointments despite having a wall calendar. Does Bible Studies. Attends the [employer] and remains active repeats oneself?  Endorsed by her friend Disoriented when walking into a room?  Patient denies except occasionally not remembering what patient came to the room for   Leaving objects in unusual places?   Denies, but does not throw her mail, bills, etc.   Wandering behavior? denies   Any personality changes since last visit? denies   Any history of depression?:  Her husband died in 12/31/2023of a stroke, and she has not yet been able to move through, has not seen  grief counselor as of yet. She became very tearful when discussion this issue.  Hallucinations or paranoia?  denies   Seizures? At age 87 she  reports having had  2 seizures when her period had come without recurrence since  Any sleep changes?  Sleeps well with melatonin. Denies vivid dreams, REM behavior or sleepwalking   Sleep apnea? denies   Any hygiene concerns?  denies   Independent of bathing and dressing?  Endorsed  Does the patient need help with medications?  Patient is in charge . She may miss a dose. Who is in charge of the finances? Patient  is in charge but has had issues with her card lately, which was declined and her friend is investigating this issue.  Any changes in appetite?  Not as it was before. She is afraid to eat foods that contain sodium Patient have trouble swallowing?  denies   Does the patient cook?  Any kitchen accidents such as leaving the stove on?  Patient denies   Any headaches?  denies   Chronic back pain?  denies   Ambulates with difficulty? Has some foot corns that will need surgery, and may limit her level of activity. Sh eis to join the [employer] soon [employer]) Recent falls or head injuries? denies     Vision changes? Denies Unilateral weakness, numbness or tingling?  denies   Any tremors?  She has chronic R hand intention tremors without any other parkinsonian signs Any anosmia?  denies   Any incontinence of urine? Intermittent, wears pads  Any bowel dysfunction?    Occasional constipation  Patient lives alone  History of heavy alcohol intake? denies   History of heavy tobacco use? Denies. [place]   Family history of dementia?      Does patient drive?yes.   CT head 2016 personally reviewed : Newly seen hypodense focus in the pons suspicious for interval lacunar infarct .  Allergies  Allergen Reactions   Amoxil [Amoxicillin] Diarrhea   Cefuroxime Axetil Diarrhea    Current Outpatient Medications  Medication Instructions   acetaminophen (TYLENOL) 1,000 mg, Oral, 2 times daily PRN   amLODipine (NORVASC) 7.5 mg, Oral, Daily   aspirin 81 mg, Oral, Daily   CALCIUM PO  1 tablet, Oral, Daily   Cholecalciferol (VITAMIN D-3 PO) 1 capsule, Oral, Daily   ferrous sulfate 325 mg, Oral, 2 times weekly, Wednesday, Sunday   Multiple Vitamin (MULTIVITAMIN) tablet 1 tablet, Oral, Daily   nitroGLYCERIN (NITROSTAT) 0.4 mg, Sublingual, Every 5 min PRN   Polyethyl Glycol-Propyl Glycol (SYSTANE OP) 1 drop, Both Eyes, Daily PRN   simvastatin (ZOCOR) 5 mg, Oral, Daily at bedtime   sucralfate (CARAFATE) 1 g, Oral, 3 times daily with meals & bedtime     VITALS:   Vitals:   10/24/22 1320  BP: (!) 115/54  Pulse: 84  Resp: 20  SpO2: 97%  Weight: 101 lb (45.8 kg)       No data to display          PHYSICAL EXAM   HEENT:  Normocephalic, atraumatic. The mucous membranes are moist. The superficial temporal arteries are without ropiness or tenderness. Cardiovascular: Regular rate and rhythm. Lungs: Clear to auscultation bilaterally. Neck: There are no carotid bruits noted bilaterally.  NEUROLOGICAL:    10/24/2022    2:00 PM  Montreal Cognitive Assessment   Visuospatial/ Executive (0/5) 4  Naming (0/3) 3  Attention: Read list of digits (0/2) 2  Attention: Read list of letters (0/1) 1  Attention: Serial 7 subtraction starting at 100 (0/3) 3  Language: Repeat phrase (0/2) 2  Language : Fluency (0/1) 0  Abstraction (0/2) 2  Delayed Recall (0/5) 2  Orientation (0/6) 6  Total 25  Adjusted Score (based on education) 25        No data to display           Orientation:  Alert and oriented to person, place and time. No aphasia or dysarthria. Fund of knowledge is appropriate. Recent memory impaired and remote memory intact.  Attention and concentration are normal.  Able to name objects and repeat phrases. Delayed recall  2/5  Cranial nerves: There is good facial symmetry. Extraocular muscles are intact and visual fields are full to confrontational testing. Speech is fluent and clear. no tongue deviation. Hearing is intact to conversational tone.  Tone: Tone is  good throughout. No cogwheeling Sensation: Sensation is intact to light touch and pinprick throughout. Vibration is intact at the bilateral big toe.There is no extinction with double simultaneous stimulation. There is no sensory dermatomal level identified. Coordination: The patient has no difficulty with RAM's or FNF bilaterally. Normal finger to nose .  Intention tremor on the R hand Motor: Strength is 5/5 in the bilateral upper and lower extremities. There is no pronator drift. There are no fasciculations noted. DTR's: Deep tendon reflexes are 2/4 at the bilateral biceps, triceps, brachioradialis, patella and achilles.  Plantar responses are downgoing bilaterally. Gait and Station: The patient is able to ambulate without difficulty.The patient is able to ambulate in a tandem fashion, able to stand in the Romberg position.     Thank you for allowing [place] the opportunity to participate in  the care of this nice patient. Please do not hesitate to contact us for any questions or concerns.   Total time spent on today's visit was 61 minutes dedicated to this patient today, preparing to see patient, examining the patient, ordering tests and/or medications and counseling the patient, documenting clinical information in the EHR or other health record, independently interpreting results and communicating results to the patient/family, discussing treatment and goals, answering patient's questions and coordinating care.  Cc:  Merlene Laughter, MD  Marlowe Kays 10/24/2022 2:43 PM

## 2022-10-24 NOTE — Progress Notes (Signed)
vitamin B12 is low.  Recommend starting on vitamin B12 1000 mcg daily.  Thyroid levels are normal. Follow-up with PCP.  Thank you

## 2022-10-24 NOTE — Patient Instructions (Addendum)

## 2022-11-08 ENCOUNTER — Other Ambulatory Visit: Payer: Medicare Other

## 2022-12-01 ENCOUNTER — Ambulatory Visit: Payer: Medicare Other | Admitting: Physician Assistant

## 2022-12-16 ENCOUNTER — Ambulatory Visit: Payer: Medicare Other | Admitting: Physician Assistant

## 2022-12-28 ENCOUNTER — Telehealth: Payer: Self-pay

## 2022-12-28 NOTE — Telephone Encounter (Signed)
Patient decided not to do MRI, says she is almost 88 years old and she didn't need it. Was very appreciative of her last visit and feels better about her self. Will follow up as scheduled per Patient. FY

## 2024-04-09 ENCOUNTER — Emergency Department (HOSPITAL_COMMUNITY): Admission: EM | Admit: 2024-04-09 | Discharge: 2024-04-09 | Disposition: A | Discharge status: Home / Self Care

## 2024-04-09 ENCOUNTER — Other Ambulatory Visit: Payer: Self-pay

## 2024-04-09 ENCOUNTER — Emergency Department (HOSPITAL_COMMUNITY)

## 2024-04-09 ENCOUNTER — Encounter (HOSPITAL_COMMUNITY): Payer: Self-pay

## 2024-04-09 DIAGNOSIS — I10 Essential (primary) hypertension: Secondary | ICD-10-CM | POA: Insufficient documentation

## 2024-04-09 DIAGNOSIS — Z7982 Long term (current) use of aspirin: Secondary | ICD-10-CM | POA: Diagnosis not present

## 2024-04-09 DIAGNOSIS — R079 Chest pain, unspecified: Secondary | ICD-10-CM | POA: Diagnosis present

## 2024-04-09 DIAGNOSIS — Z79899 Other long term (current) drug therapy: Secondary | ICD-10-CM | POA: Diagnosis not present

## 2024-04-09 LAB — CBC
HCT: 38.5 % (ref 36.0–46.0)
Hemoglobin: 11.9 g/dL — ABNORMAL LOW (ref 12.0–15.0)
MCH: 28.7 pg (ref 26.0–34.0)
MCHC: 30.9 g/dL (ref 30.0–36.0)
MCV: 92.8 fL (ref 80.0–100.0)
Platelets: 306 K/uL (ref 150–400)
RBC: 4.15 MIL/uL (ref 3.87–5.11)
RDW: 14.1 % (ref 11.5–15.5)
WBC: 7.7 K/uL (ref 4.0–10.5)
nRBC: 0 % (ref 0.0–0.2)

## 2024-04-09 LAB — BASIC METABOLIC PANEL WITH GFR
Anion gap: 10 (ref 5–15)
BUN: 15 mg/dL (ref 8–23)
CO2: 24 mmol/L (ref 22–32)
Calcium: 9 mg/dL (ref 8.9–10.3)
Chloride: 106 mmol/L (ref 98–111)
Creatinine, Ser: 0.73 mg/dL (ref 0.44–1.00)
GFR, Estimated: >60 mL/min (ref >60)
Glucose, Bld: 93 mg/dL (ref 70–99)
Potassium: 5 mmol/L (ref 3.5–5.1)
Sodium: 140 mmol/L (ref 135–145)

## 2024-04-09 LAB — TROPONIN T, HIGH SENSITIVITY
Troponin T High Sensitivity: 18 ng/L (ref 0–19)
Troponin T High Sensitivity: 18 ng/L (ref 0–19)

## 2024-04-09 NOTE — ED Triage Notes (Signed)
 Patient has had off and on middle chest pain that happens when she walks that has been going on for months. Pain does not radiate. No nausea or vomiting. No shortness of breath.

## 2024-04-09 NOTE — Discharge Instructions (Signed)
 Take daily baby aspirin and stagger medications as prescribed.  Call and follow-up with your primary care doctor.  You can also call the cardiologist office to try to make an appointment.  A referral was placed in the ER.  Return to the ER for any new or worsening symptoms.

## 2024-04-09 NOTE — ED Provider Notes (Signed)
 [facility] AT [facility] Provider Note   CSN: [id number] Arrival date & time: 04/09/24  1533     Patient presents with: Chest Pain   [name] is a 89 y.o. female.   89 year old female presents for evaluation of chest pain.  She states she has been chest pain with exertion that lasted about 10 minutes for the last couple days.  She states she is not getting short of breath or lightheaded.  States she has no chest pain at rest.  She does have a history of hypertension she states she takes a baby aspirin every day.  Has never seen cardiology before.  Denies any other symptoms or concerns at this time.   Chest Pain Associated symptoms: no abdominal pain, no back pain, no cough, no fever, no palpitations, no shortness of breath and no vomiting        Prior to Admission medications   Medication Sig Start Date End Date Taking? Authorizing Provider  acetaminophen  (TYLENOL ) 500 MG tablet Take 1,000 mg by mouth 2 (two) times daily as needed for mild pain.    [provider]  amLODipine (NORVASC) 5 MG tablet Take 7.5 mg by mouth daily. 04/09/15 10/24/22  [provider]  aspirin 81 MG tablet Take 81 mg by mouth daily.     [provider]  CALCIUM PO Take 1 tablet by mouth daily.    [provider]  Cholecalciferol (VITAMIN D-3 PO) Take 1 capsule by mouth daily.    [provider]  ferrous sulfate 325 (65 FE) MG tablet Take 325 mg by mouth 2 (two) times a week. Wednesday, Sunday    [provider]  Multiple Vitamin (MULTIVITAMIN) tablet Take 1 tablet by mouth daily.    [provider]  nitroGLYCERIN (NITROSTAT) 0.4 MG SL tablet Place 0.4 mg under the tongue every 5 (five) minutes as needed for chest pain (for chest pain).    [provider]  Polyethyl Glycol-Propyl Glycol (SYSTANE OP) Place 1 drop into both eyes daily as needed (dry eyes).    [provider]   simvastatin (ZOCOR) 5 MG tablet Take 5 mg by mouth at bedtime.    [provider]  sucralfate  (CARAFATE ) 1 g tablet Take 1 tablet (1 g total) by mouth 4 (four) times daily -  with meals and at bedtime. 12/15/21   Tegeler, Lonni PARAS, MD    Allergies: Amoxil [amoxicillin] and Cefuroxime axetil    Review of Systems  Constitutional:  Negative for chills and fever.  HENT:  Negative for ear pain and sore throat.   Eyes:  Negative for pain and visual disturbance.  Respiratory:  Negative for cough and shortness of breath.   Cardiovascular:  Positive for chest pain. Negative for palpitations.  Gastrointestinal:  Negative for abdominal pain and vomiting.  Genitourinary:  Negative for dysuria and hematuria.  Musculoskeletal:  Negative for arthralgias and back pain.  Skin:  Negative for color change and rash.  Neurological:  Negative for seizures and syncope.  All other systems reviewed and are negative.   Updated Vital Signs BP (!) 180/84 (BP Location: Left Arm)   Pulse 90   Temp 99.6 F (37.6 C) (Oral)   Resp 16   Ht 5' 2 (1.575 m)   Wt 43.5 kg   SpO2 100%   BMI 17.56 kg/m   Physical Exam Vitals and nursing note reviewed.  Constitutional:      General: She is not in acute  distress.    Appearance: She is well-developed.  HENT:     Head: Normocephalic and atraumatic.  Eyes:     Conjunctiva/sclera: Conjunctivae normal.  Cardiovascular:     Rate and Rhythm: Normal rate and regular rhythm.     Heart sounds: No murmur heard. Pulmonary:     Effort: Pulmonary effort is normal. No respiratory distress.     Breath sounds: Normal breath sounds.  Abdominal:     Palpations: Abdomen is soft.     Tenderness: There is no abdominal tenderness.  Musculoskeletal:        General: No swelling.     Cervical back: Neck supple.  Skin:    General: Skin is warm and dry.     Capillary Refill: Capillary refill takes less than 2 seconds.  Neurological:     General: No focal deficit  present.     Mental Status: She is alert.  Psychiatric:        Mood and Affect: Mood normal.     (all labs ordered are listed, but only abnormal results are displayed) Labs Reviewed  CBC - Abnormal; Notable for the following components:      Result Value   Hemoglobin 11.9 (*)    All other components within normal limits  BASIC METABOLIC PANEL WITH GFR  TROPONIN T, HIGH SENSITIVITY  TROPONIN T, HIGH SENSITIVITY    EKG: EKG Interpretation Date/Time:  Tuesday April 09 2024 15:50:05 EDT Ventricular Rate:  100 PR Interval:  125 QRS Duration:  150 QT Interval:  387 QTC Calculation: 500 R Axis:   -21  Text Interpretation: Sinus tachycardia Nonspecific intraventricular conduction delay Baseline artifact Compared with prior EKG from 12/15/2021 Confirmed by Gennaro Bouchard ([id number]) on 04/09/2024 6:06:08 PM  Radiology: DG Chest 2 View Result Date: 04/09/2024 CLINICAL DATA:  chest pain EXAM: CHEST - 2 VIEW COMPARISON:  December 15, 2021 FINDINGS: No focal airspace consolidation, pleural effusion, or pneumothorax. No cardiomegaly. Tortuous aorta with aortic atherosclerosis. No acute fracture or destructive lesions. Multilevel thoracic osteophytosis. Osteopenia. IMPRESSION: No acute cardiopulmonary abnormality. Electronically Signed   By: Rogelia Myers M.D.   On: 04/09/2024 17:04     Procedures   Medications Ordered in the ED - No data to display                                  Medical Decision Making Cardiac monitor interpretation: Sinus rhythm, no ectopy  Patient appears well.  She has been having some exertional chest pain over the last few weeks.  Troponins negative x 2 and lab workup chest EKG within normal limits.  I admission and recommend if for further cardiac workup as I think she is having episodes of angina but she would like to go home and plan to follow-up with her primary care doctor.  Will give her a referral for cardiology.  She is already taking a daily baby aspirin  and is on medication for hypertension.  Advise close follow-up with primary care and otherwise return to the ER for new or worsening symptoms or if she changes her mind about being admitted.  She is given strict return precautions.  Patient and family at bedside felt comfortable with the plan for discharge.  All results were discussed with them.  Problems Addressed: Chest pain, unspecified type: undiagnosed new problem with uncertain prognosis  Amount and/or Complexity of Data Reviewed External Data Reviewed: notes.    Details: Previous  ED records reviewed and patient seen 12/07/2021 for chest pain and was diagnosed with GERD and a hiatal hernia Labs: ordered. Decision-making details documented in ED Course.    Details: Ordered and reviewed by me and unremarkable, troponins negative x 2 Radiology: ordered and independent interpretation performed. Decision-making details documented in ED Course.    Details: Ordered and interpreted by me independently radiology Chest x-ray: Shows no acute abnormality ECG/medicine tests: ordered and independent interpretation performed. Decision-making details documented in ED Course.    Details: Ordered and interpreted by me in the absence of cardiology and shows sinus rhythm, no STEMI or acute change when compared to prior  Risk OTC drugs. Prescription drug management.     Final diagnoses:  Chest pain, unspecified type    ED Discharge Orders          Ordered    Ambulatory referral to Cardiology       Comments: Patient with exertional chest pain   04/09/24 1916               Gennaro Duwaine CROME, DO 04/09/24 2253

## 2024-04-10 NOTE — Progress Notes (Signed)
 Cardiology Office Note    Date:  04/14/2024  ID:  Jahni, Paul Feb 06, 1931, MRN [id number] PCP:  Roseann Coad, MD (Inactive)  Cardiologist:  None  Electrophysiologist:  None   Chief Complaint: Chest pain   History of Present Illness: .    [name] is a 89 y.o. female with visit-pertinent history of hypertension, left carotid endarterectomy in 2005, CVA in 2016, hyperlipidemia.  On chart review patient previously saw Dr. Jeffrie in 2016 for evaluation of chest pain, patient had MPI that was normal and low risk.  She was lost to follow-up  Patient presented to [facility] ED on 04/09/2024 for evaluation of chest pain.  She reported she been having chest pain with exertion that lasted 10 minutes for a few days.  She denies any lightheadedness or shortness of breath.  Denies chest pain at rest.  Patient's high sensitivity troponins were negative x 2.  Today she presents with her daughter-in-law, Landry for [facility] for new patient evaluation.  Patient reports that she has not had any further chest discomfort this week however reports that she has stopped taking her walks.  Patient reports that she was previously going for 45-minute walks in her neighborhood, in the last 2 weeks has noted some epigastric discomfort while on her walks that typically will resolve with rest.  She denies any sensation of acid reflux or discomfort following eating.  She denies any significant shortness of breath, lower extremity edema, orthopnea or PND.  Patient's daughter-in-law notes that she does have a history of a hiatal hernia although notes this has not been recently evaluated.  Patient reports as long as she is not outside walking for long periods of time she does not have any other chest pain, pressure or tightness.  Patient's daughter-in-law does note timing as patient's 28 year old sister recently had a heart attack with stents placed.   Patient reports that she has 6  sisters, at least to have history of coronary artery disease.  Her 80 year old sister recently had a heart attack with stents placed.  Labwork independently reviewed: 04/09/2024: Sodium 140, potassium 5.0, creatinine 0.73, high since troponin 18, hemoglobin 11.9, hematocrit 38.5 ROS: .   Today she denies shortness of breath, lower extremity edema, fatigue, palpitations, melena, hematuria, hemoptysis, diaphoresis, weakness, presyncope, syncope, orthopnea, and PND.  All other systems are reviewed and otherwise negative. Studies Reviewed: SABRA   EKG:  EKG is ordered today, personally reviewed, demonstrating  EKG Interpretation Date/Time:  Friday April 12 2024 15:43:14 EDT Ventricular Rate:  67 PR Interval:  140 QRS Duration:  78 QT Interval:  372 QTC Calculation: 393 R Axis:   45  Text Interpretation: Sinus rhythm Cannot rule out Anterior infarct , age undetermined Confirmed by Dimples Probus [phone]) on 04/14/2024 11:23:39 AM   CV Studies: Cardiac studies reviewed are outlined and summarized above. Otherwise please see EMR for full report. Cardiac Studies & Procedures   ______________________________________________________________________________________________   STRESS TESTS  MYOCARDIAL PERFUSION IMAGING 10/16/2014            ______________________________________________________________________________________________       Current Reported Medications:.    Current Meds  Medication Sig   acetaminophen  (TYLENOL ) 500 MG tablet Take 1,000 mg by mouth 2 (two) times daily as needed for mild pain.   amLODipine (NORVASC) 5 MG tablet Take 7.5 mg by mouth daily.   aspirin 81 MG tablet Take 81 mg by mouth daily.    CALCIUM PO Take 1 tablet by mouth daily.  Cholecalciferol (VITAMIN D-3 PO) Take 1 capsule by mouth daily.   ferrous sulfate 325 (65 FE) MG tablet Take 325 mg by mouth 2 (two) times a week. Wednesday, Sunday   Multiple Vitamin (MULTIVITAMIN) tablet Take 1 tablet by mouth  daily.   nitroGLYCERIN (NITROSTAT) 0.4 MG SL tablet Place 0.4 mg under the tongue every 5 (five) minutes as needed for chest pain (for chest pain).   Polyethyl Glycol-Propyl Glycol (SYSTANE OP) Place 1 drop into both eyes daily as needed (dry eyes).   simvastatin (ZOCOR) 5 MG tablet Take 5 mg by mouth at bedtime.   sucralfate  (CARAFATE ) 1 g tablet Take 1 tablet (1 g total) by mouth 4 (four) times daily -  with meals and at bedtime.    Physical Exam:    VS:  BP (!) 144/74   Pulse 86   Ht 5' 2.5 (1.588 m)   Wt 96 lb 12.8 oz (43.9 kg)   SpO2 96%   BMI 17.42 kg/m    Wt Readings from Last 3 Encounters:  04/12/24 96 lb 12.8 oz (43.9 kg)  04/09/24 96 lb (43.5 kg)  10/24/22 101 lb (45.8 kg)    GEN: Well nourished, well developed in no acute distress NECK: No JVD; No carotid bruits CARDIAC: RRR, no murmurs, rubs, gallops RESPIRATORY:  Clear to auscultation without rales, wheezing or rhonchi  ABDOMEN: Soft, non-tender, non-distended EXTREMITIES:  No edema; No acute deformity     Asessement and Plan:.    Chest pain: Patient noted to have aortic and coronary artery atherosclerosis on CT angio chest in 11/2021. Patient reports that in recent weeks she has had some epigastric discomfort when out walking in her neighborhood, she reports that she would go walking for 45 minutes and have a pressure-like sensation in the epigastric region, denies any radiation, shortness of breath, diaphoresis, nausea or vomiting.  She denies any feelings of acid reflux however notes a history of GERD and has a hiatal hernia.  She reports that she is no longer taking long walks in her neighborhood and has not had any recurrent chest discomfort however notes only been a few days.  She denies any other episodes of chest pain, tightness or pressure.  Patient's daughter-in-law notes that her sister recently had a heart attack with stent placements.  Discussed different options with patient such as nuclear stress test,  coronary CTA and potential of cardiac catheterization.  Patient reports that she would not be interested in stress testing and would only agree to a cardiac catheterization if urgently needed, she prefer to avoid invasive procedures.  Patient agreeable to checking echocardiogram, deferred coronary CTA at this time.  Would like to proceed with medical management, she will notify the office of her current dose of amlodipine.  She will continue to monitor her symptoms.  Reviewed ED precautions.  Continue aspirin 81 mg daily and simvastatin 5 mg daily.  Hypertension: Blood pressure today 144/74.  Patient reports that her PCP has been adjusting her amlodipine, she is unsure as to current dose she is taking.  They will notify the office later today as to her current dosage.  Carotid stenosis: s/p left carotid endarterectomy in 2006.  Last carotid duplex in 2021 indicated right ICA consistent with 40 to 59% stenosis, left carotid with no evidence of stenosis in the left ICA.  Patient reports that she is not interested in any further carotid scans or invasive procedures, will will not recheck carotid Dopplers at this time per patient request.  History of CVA: On chart review patient was admitted to [facility] in 2016 after presenting with facial droop and speech difficulty.  Patient had intracranial hemorrhage per notes associated with hypertensive bleed.  Patient was discharged on low-dose amlodipine, repeat CAT scan 24 hours later was stable with no vasogenic edema or midline shift or worsening hemorrhage.  Patient denies any recurrence or residuals.   Disposition: F/u with Abir Eroh, NP in 4-6 weeks.   Signed, Angelo Prindle D Yuval Nolet, NP

## 2024-04-12 ENCOUNTER — Telehealth: Payer: Self-pay | Admitting: Cardiology

## 2024-04-12 ENCOUNTER — Ambulatory Visit: Attending: Cardiology | Admitting: Cardiology

## 2024-04-12 ENCOUNTER — Encounter: Payer: Self-pay | Admitting: Cardiology

## 2024-04-12 VITALS — BP 144/74 | HR 86 | Ht 62.5 in | Wt 96.8 lb

## 2024-04-12 DIAGNOSIS — R079 Chest pain, unspecified: Secondary | ICD-10-CM | POA: Diagnosis not present

## 2024-04-12 DIAGNOSIS — I10 Essential (primary) hypertension: Secondary | ICD-10-CM | POA: Diagnosis not present

## 2024-04-12 DIAGNOSIS — Z8679 Personal history of other diseases of the circulatory system: Secondary | ICD-10-CM

## 2024-04-12 DIAGNOSIS — Z8673 Personal history of transient ischemic attack (TIA), and cerebral infarction without residual deficits: Secondary | ICD-10-CM | POA: Diagnosis not present

## 2024-04-12 NOTE — Telephone Encounter (Signed)
 Pt aware I will forward this Katlyn West, NP for his knowledge.

## 2024-04-12 NOTE — Patient Instructions (Signed)
 Medication Instructions:  Please call our office [phone] to inform us  of the how much Amlodipine you are taking.  *If you need a refill on your cardiac medications before your next appointment, please call your pharmacy*  Lab Work: NONE If you have labs (blood work) drawn today and your tests are completely normal, you will receive your results only by: MyChart Message (if you have MyChart) OR A paper copy in the mail If you have any lab test that is abnormal or we need to change your treatment, we will call you to review the results.  Testing/Procedures: Your physician has requested that you have an echocardiogram. Echocardiography is a painless test that uses sound waves to create images of your heart. It provides your doctor with information about the size and shape of your heart and how well your heart's chambers and valves are working. This procedure takes approximately one hour. There are no restrictions for this procedure. Please do NOT wear cologne, perfume, aftershave, or lotions (deodorant is allowed). Please arrive 15 minutes prior to your appointment time.  Please note: We ask at that you not bring children with you during ultrasound (echo/ vascular) testing. Due to room size and safety concerns, children are not allowed in the ultrasound rooms during exams. Our front office staff cannot provide observation of children in our lobby area while testing is being conducted. An adult accompanying a patient to their appointment will only be allowed in the ultrasound room at the discretion of the ultrasound technician under special circumstances. We apologize for any inconvenience.   Follow-Up: At [facility], you and your health needs are our priority.  As part of our continuing mission to provide you with exceptional heart care, our providers are all part of one team.  This team includes your primary Cardiologist (physician) and Advanced Practice Providers or APPs (Physician  Assistants and Nurse Practitioners) who all work together to provide you with the care you need, when you need it.  Your next appointment:   6-8 wks   Provider:   Katlyn West, NP   We recommend signing up for the patient portal called MyChart.  Sign up information is provided on this After Visit Summary.  MyChart is used to connect with patients for Virtual Visits (Telemedicine).  Patients are able to view lab/test results, encounter notes, upcoming appointments, etc.  Non-urgent messages can be sent to your provider as well.   To learn more about what you can do with MyChart, go to [email or web address].

## 2024-04-12 NOTE — Telephone Encounter (Signed)
 Pt c/o medication issue:  1. Name of Medication: amLODipine (NORVASC) 5 MG tablet   2. How are you currently taking this medication (dosage and times per day)?    3. Are you having a reaction (difficulty breathing--STAT)? no  4. What is your medication issue? Called back to say patient is taking half a tablet (2.5) each day

## 2024-04-14 ENCOUNTER — Encounter: Payer: Self-pay | Admitting: Cardiology

## 2024-04-15 MED ORDER — AMLODIPINE BESYLATE 5 MG PO TABS: 5.0000 mg | ORAL_TABLET | Freq: Every day | ORAL | 1 refills | Status: AC

## 2024-04-15 NOTE — Telephone Encounter (Signed)
 Contacted the patient and gave her Katlyn's recommendations. She voiced understanding.

## 2024-04-15 NOTE — Telephone Encounter (Signed)
 Thank you, recommend Ms. Glorioso increase amlodipine to 5 mg daily. She should monitor her blood pressure at home and notify the office if she experiences any increased dizziness or lightheadedness.

## 2024-05-28 ENCOUNTER — Ambulatory Visit (HOSPITAL_COMMUNITY): Admission: RE | Admit: 2024-05-28 | Discharge: 2024-05-28 | Attending: Cardiology | Admitting: Cardiology

## 2024-05-28 DIAGNOSIS — R079 Chest pain, unspecified: Secondary | ICD-10-CM

## 2024-05-28 LAB — ECHOCARDIOGRAM COMPLETE
Area-P 1/2: 3.37 cm2
S' Lateral: 1.6 cm

## 2024-05-29 ENCOUNTER — Ambulatory Visit: Payer: Self-pay | Admitting: Cardiology

## 2024-06-04 NOTE — Progress Notes (Unsigned)
 Cardiology Office Note    Date:  06/04/2024  ID:  [name], [name] 1930/06/25, MRN [id number] PCP:  Patient, No Pcp Per  Cardiologist:  None  Electrophysiologist:  None   Chief Complaint: ***  History of Present Illness: .    [name] is a 89 y.o. female with visit-pertinent history of hypertension, left carotid endarterectomy in 2005, CVA in 2016, hyperlipidemia.   On chart review patient previously saw Dr. Jeffrie in 2016 for evaluation of chest pain, patient had MPI that was normal and low risk.  She was lost to follow-up   Patient presented to [name], ED on 04/09/2024 for evaluation of chest pain.  She reported she been having chest pain with exertion that lasted 10 minutes for a few days.  She denies any lightheadedness or shortness of breath.  Denies chest pain at rest.  Patient's high sensitivity troponins were negative x 2.  [facility] for new patient evaluation. She denied any further chest pain however reported she had stopped taking walks outside. Patient noted for the prior two weeks while walking 45 minutes outside she had a slight epigastric discomfort that would resolve with rest. She denied shortness of breath, lower extremity edema, orthopnea or PND.  Patient's daughter-in-Hudson noted that she had history of a hiatal hernia. Patient's daughter-in-Hudson noted timing as patient's 104 year old sister had recently had a heart attack with stents placed. Discussed different options with patient such as nuclear stress test, coronary CTA and potential of cardiac catheterization. Patient declined stress test or cardiac cathereterization unless needed in an urgent situation. She was agreeable to echocardiogram and her amlodipine  was increased to 5 mg daily.   Echocardiogram on 05/28/2024 indicated LVEF 60 to 65%, no RWMA, G2 DD, RV systolic function size normal, normal PASP.  Mild mitral valve regurgitation, no evidence  of stenosis, aortic valve was tricuspid, aortic valve regurgitation not visualized, aortic valve sclerosis/calcification present without evidence of stenosis.  Today she presents for follow up. She reports that she   Chest pain/CAD: Patient noted to have aortic and coronary artery atherosclerosis on CT angio chest in 11/2021.  Echocardiogram on 05/28/2024 indicated LVEF 60 to 65%, no RWMA, G2 DD, RV systolic function size normal, normal PASP. Today she reports that she  Continue  Reviewed ED precautions   Hypertension: Blood pressure today   Carotid stenosis: s/p left carotid endarterectomy in 2006.  Last carotid duplex in 2021 indicated right ICA consistent with 40 to 59% stenosis, left carotid with no evidence of stenosis in the left ICA.  Patient reports that she is not interested in any further carotid scans or invasive procedures, will will not recheck carotid Dopplers at this time per patient request.   History of CVA: On chart review patient was admitted to [facility] in 2016 after presenting with facial droop and speech difficulty.  Patient had intracranial hemorrhage per notes associated with hypertensive bleed.  Patient was discharged on low-dose amlodipine , repeat CAT scan 24 hours later was stable with no vasogenic edema or midline shift or worsening hemorrhage.  Patient denies any recurrence or residuals.   Labwork independently reviewed:   ROS: .   *** denies chest pain, shortness of breath, lower extremity edema, fatigue, palpitations, melena, hematuria, hemoptysis, diaphoresis, weakness, presyncope, syncope, orthopnea, and PND.  All other systems are reviewed and otherwise negative.  Studies Reviewed: SABRA    EKG:  EKG is ordered today, personally reviewed, demonstrating ***  CV Studies: Cardiac studies reviewed are outlined and summarized above. Otherwise please see EMR for full report. Cardiac Studies & Procedures    ______________________________________________________________________________________________   STRESS TESTS  MYOCARDIAL PERFUSION IMAGING 10/16/2014   ECHOCARDIOGRAM  ECHOCARDIOGRAM COMPLETE 05/28/2024  Narrative ECHOCARDIOGRAM REPORT    Patient Name:   [name] Date of Exam: 05/28/2024 Medical Rec #:  [id number]                Height:       62.5 in Accession #:    [id number]               Weight:       96.8 lb Date of Birth:  [birth year]               BSA:          1.413 m Patient Age:    89 years                 BP:           141/68 mmHg Patient Gender: F                        HR:           79 bpm. Exam Location:  [address]  Procedure: 2D Echo, 3D Echo, Cardiac Doppler and Color Doppler (Both Spectral and Color Flow Doppler were utilized during procedure).  Indications:    R07.9 Chest Pain  History:        Patient has no prior history of Echocardiogram examinations. Stroke and Carotid Disease; Risk Factors:HLD.  Sonographer:    Waldo Guadalajara RCS Referring Phys: [id number] Tarvaris Puglia D Brayam Boeke  IMPRESSIONS   1. Left ventricular ejection fraction, by estimation, is 60 to 65%. Left ventricular ejection fraction by 3D volume is 57 %. The left ventricle has normal function. The left ventricle has no regional wall motion abnormalities. Left ventricular diastolic parameters are consistent with Grade II diastolic dysfunction (pseudonormalization). 2. Right ventricular systolic function is normal. The right ventricular size is normal. There is normal pulmonary artery systolic pressure. 3. The mitral valve is normal in structure. Mild mitral valve regurgitation. No evidence of mitral stenosis. 4. The tricuspid valve is myxomatous. 5. The aortic valve is tricuspid. Aortic valve regurgitation is not visualized. Aortic valve sclerosis/calcification is present, without any evidence of aortic stenosis. 6. The inferior vena cava is normal in size with greater than 50%  respiratory variability, suggesting right atrial pressure of 3 mmHg.  FINDINGS Left Ventricle: Left ventricular ejection fraction, by estimation, is 60 to 65%. Left ventricular ejection fraction by 3D volume is 57 %. The left ventricle has normal function. The left ventricle has no regional wall motion abnormalities. The left ventricular internal cavity size was normal in size. There is no left ventricular hypertrophy. Left ventricular diastolic parameters are consistent with Grade II diastolic dysfunction (pseudonormalization).  Right Ventricle: The right ventricular size is normal. No increase in right ventricular wall thickness. Right ventricular systolic function is normal. There is normal pulmonary artery systolic pressure. The tricuspid regurgitant velocity is 2.59 m/s, and with an assumed right atrial pressure of 3 mmHg, the estimated right ventricular systolic pressure is 29.8 mmHg.  Left Atrium: Left atrial size was normal in size.  Right Atrium: Right atrial size was normal in size.  Pericardium: There is no evidence of pericardial effusion.  Mitral Valve: The mitral valve is normal in structure. Mild mitral valve regurgitation.  No evidence of mitral valve stenosis.  Tricuspid Valve: The tricuspid valve is myxomatous. Tricuspid valve regurgitation is mild . No evidence of tricuspid stenosis.  Aortic Valve: The aortic valve is tricuspid. Aortic valve regurgitation is not visualized. Aortic valve sclerosis/calcification is present, without any evidence of aortic stenosis.  Pulmonic Valve: The pulmonic valve was normal in structure. Pulmonic valve regurgitation is trivial. No evidence of pulmonic stenosis.  Aorta: The aortic root is normal in size and structure.  Venous: The inferior vena cava is normal in size with greater than 50% respiratory variability, suggesting right atrial pressure of 3 mmHg.  IAS/Shunts: No atrial level shunt detected by color flow Doppler.  Additional  Comments: 3D was performed not requiring image post processing on an independent workstation and was normal.   LEFT VENTRICLE PLAX 2D LVIDd:         3.60 cm         Diastology LVIDs:         1.60 cm         LV e' medial:    5.11 cm/s LV PW:         0.80 cm         LV E/e' medial:  20.9 LV IVS:        0.80 cm         LV e' lateral:   7.51 cm/s LVOT diam:     1.80 cm         LV E/e' lateral: 14.2 LV SV:         82 LV SV Index:   58 LVOT Area:     2.54 cm        3D Volume EF LV IVRT:       77 msec         LV 3D EF:    Left ventricul ar ejection fraction by 3D volume is 57 %.  3D Volume EF: 3D EF:        57 % LV EDV:       56 ml LV ESV:       24 ml LV SV:        32 ml  RIGHT VENTRICLE RV Basal diam:  3.20 cm     PULMONARY VEINS RV S prime:     14.00 cm/s  A Reversal Velocity: 32.00 cm/s TAPSE (M-mode): 2.1 cm      Diastolic Velocity:  31.40 cm/s RVSP:           29.8 mmHg   S/D Velocity:        2.10 Systolic Velocity:   65.50 cm/s  LEFT ATRIUM             Index        RIGHT ATRIUM           Index LA diam:        3.80 cm 2.69 cm/m   RA Pressure: 3.00 mmHg LA Vol (A2C):   32.9 ml 23.29 ml/m  RA Area:     9.65 cm LA Vol (A4C):   30.9 ml 21.87 ml/m  RA Volume:   21.50 ml  15.22 ml/m LA Biplane Vol: 33.6 ml 23.78 ml/m AORTIC VALVE LVOT Vmax:   146.00 cm/s LVOT Vmean:  94.000 cm/s LVOT VTI:    0.323 m  AORTA Ao Root diam: 2.20 cm Ao Asc diam:  3.00 cm  MITRAL VALVE                TRICUSPID  VALVE MV Area (PHT):              TR Peak grad:   26.8 mmHg MV Decel Time:              TR Vmax:        259.00 cm/s MV E velocity  107.00 cm/s  Estimated RAP:  3.00 mmHg MV A velocity: 128.00 cm/s  RVSP:           29.8 mmHg MV E/A ratio:  0.84 SHUNTS Systemic VTI:  0.32 m Systemic Diam: 1.80 cm  Morene Brownie Electronically signed by Morene Brownie Signature Date/Time: 05/28/2024/5:29:58 PM    Final           ______________________________________________________________________________________________       Current Reported Medications:.    Active Medications[1]  Physical Exam:    VS:  There were no vitals taken for this visit.   Wt Readings from Last 3 Encounters:  04/12/24 96 lb 12.8 oz (43.9 kg)  04/09/24 96 lb (43.5 kg)  10/24/22 101 lb (45.8 kg)    GEN: Well nourished, well developed in no acute distress NECK: No JVD; No carotid bruits CARDIAC: ***RRR, no murmurs, rubs, gallops RESPIRATORY:  Clear to auscultation without rales, wheezing or rhonchi  ABDOMEN: Soft, non-tender, non-distended EXTREMITIES:  No edema; No acute deformity     Asessement and Plan:.     ***     Disposition: F/u with ***  Signed, Arya Luttrull D Rivky Clendenning, NP      [1]  No outpatient medications have been marked as taking for the 06/07/24 encounter (Appointment) with Frank Pilger D, NP.

## 2024-06-07 ENCOUNTER — Encounter: Payer: Self-pay | Admitting: Cardiology

## 2024-06-07 ENCOUNTER — Ambulatory Visit: Attending: Cardiology | Admitting: Cardiology

## 2024-06-07 VITALS — BP 120/54 | HR 88 | Ht 62.0 in | Wt 99.0 lb

## 2024-06-07 DIAGNOSIS — Z8673 Personal history of transient ischemic attack (TIA), and cerebral infarction without residual deficits: Secondary | ICD-10-CM

## 2024-06-07 DIAGNOSIS — I10 Essential (primary) hypertension: Secondary | ICD-10-CM | POA: Diagnosis not present

## 2024-06-07 DIAGNOSIS — Z8679 Personal history of other diseases of the circulatory system: Secondary | ICD-10-CM | POA: Diagnosis not present

## 2024-06-07 DIAGNOSIS — R079 Chest pain, unspecified: Secondary | ICD-10-CM | POA: Diagnosis not present

## 2024-06-07 NOTE — Patient Instructions (Addendum)
 Thank you for choosing [facility]!     Medication Instructions:  No medication changes were made during today's visit.  *If you need a refill on your cardiac medications before your next appointment, please call your pharmacy*   Lab Work: No labs were ordered during today's visit.  If you have labs (blood work) drawn today and your tests are completely normal, you will receive your results only by: MyChart Message (if you have MyChart) OR A paper copy in the mail If you have any lab test that is abnormal or we need to change your treatment, we will call you to review the results.   Testing/Procedures: No procedures were ordered during today's visit.   Your next appointment:   4-5 month(s)   Provider:   Dr. Jeffrie    Follow-Up: At [facility], you and your health needs are our priority.  As part of our continuing mission to provide you with exceptional heart care, we have created designated Provider Care Teams.  These Care Teams include your primary Cardiologist (physician) and Advanced Practice Providers (APPs -  Physician Assistants and Nurse Practitioners) who all work together to provide you with the care you need, when you need it. We recommend signing up for the patient portal called MyChart.  Sign up information is provided on this After Visit Summary.  MyChart is used to connect with patients for Virtual Visits (Telemedicine).  Patients are able to view lab/test results, encounter notes, upcoming appointments, etc.  Non-urgent messages can be sent to your provider as well.   To learn more about what you can do with MyChart, go to [email or web address].

## 2024-06-22 ENCOUNTER — Emergency Department (HOSPITAL_COMMUNITY)

## 2024-06-22 ENCOUNTER — Other Ambulatory Visit: Payer: Self-pay

## 2024-06-22 ENCOUNTER — Emergency Department (HOSPITAL_COMMUNITY): Admission: EM | Admit: 2024-06-22 | Discharge: 2024-06-22 | Disposition: A | Discharge status: Home / Self Care

## 2024-06-22 DIAGNOSIS — Z961 Presence of intraocular lens: Secondary | ICD-10-CM | POA: Insufficient documentation

## 2024-06-22 DIAGNOSIS — Y9301 Activity, walking, marching and hiking: Secondary | ICD-10-CM | POA: Insufficient documentation

## 2024-06-22 DIAGNOSIS — W1830XA Fall on same level, unspecified, initial encounter: Secondary | ICD-10-CM | POA: Insufficient documentation

## 2024-06-22 DIAGNOSIS — H1132 Conjunctival hemorrhage, left eye: Secondary | ICD-10-CM | POA: Insufficient documentation

## 2024-06-22 DIAGNOSIS — Z23 Encounter for immunization: Secondary | ICD-10-CM | POA: Diagnosis not present

## 2024-06-22 DIAGNOSIS — S0240DA Maxillary fracture, left side, initial encounter for closed fracture: Secondary | ICD-10-CM | POA: Insufficient documentation

## 2024-06-22 DIAGNOSIS — R519 Headache, unspecified: Secondary | ICD-10-CM | POA: Diagnosis present

## 2024-06-22 DIAGNOSIS — Z7982 Long term (current) use of aspirin: Secondary | ICD-10-CM | POA: Diagnosis not present

## 2024-06-22 DIAGNOSIS — S02401A Maxillary fracture, unspecified, initial encounter for closed fracture: Secondary | ICD-10-CM

## 2024-06-22 DIAGNOSIS — I10 Essential (primary) hypertension: Secondary | ICD-10-CM | POA: Insufficient documentation

## 2024-06-22 DIAGNOSIS — S0232XA Fracture of orbital floor, left side, initial encounter for closed fracture: Secondary | ICD-10-CM | POA: Diagnosis not present

## 2024-06-22 MED ORDER — TETRACAINE HCL 0.5 % OP SOLN: 1.0000 [drp] | Freq: Once | OPHTHALMIC | Status: DC

## 2024-06-22 MED ORDER — TETANUS-DIPHTH-ACELL PERTUSSIS 5-2-15.5 LF-MCG/0.5 IM SUSP
0.5000 mL | Freq: Once | INTRAMUSCULAR | Status: AC
  Administered 2024-06-22: 0.5 mL via INTRAMUSCULAR
  Filled 2024-06-22: qty 0.5

## 2024-06-22 NOTE — ED Triage Notes (Signed)
 Pt bib [facility] from home. Pt was walking and fell forward onto concrete and hit face. No LOC or thinners. C/o no pain. Laceration to left eyebrow and obvious hematoma to left eye

## 2024-06-22 NOTE — ED Provider Notes (Signed)
 " [facility]  [facility]OS[facility] Provider Note   CSN: [id number] Arrival date & time: 06/22/24  1609     Patient presents with: [name] is a 89 y.o. female.   89 year old female with past medical history of hypertension and hyperlipidemia is on aspirin presenting to the emergency department today after a fall.  The patient states that she was walking downhill when she lost her footing.  She fell forward.  Did strike her head.  She did not lose consciousness.  She had some swelling noted around the left eye that was getting worse that she came to the ER for further evaluation.  Patient is on baby aspirin daily but no other blood thinner/antiplatelet medications.  She denies any nausea or vomiting.  Denies any neck pain.  She denies any other injuries.   Fall Associated symptoms include headaches.       Prior to Admission medications  Medication Sig Start Date End Date Taking? Authorizing Provider  acetaminophen  (TYLENOL ) 500 MG tablet Take 1,000 mg by mouth 2 (two) times daily as needed for mild pain.    [provider]  amLODipine  (NORVASC ) 5 MG tablet Take 1 tablet (5 mg total) by mouth daily. 04/15/24   West, Katlyn D, NP  aspirin 81 MG tablet Take 81 mg by mouth daily.     [provider]  CALCIUM PO Take 1 tablet by mouth daily.    [provider]  Cholecalciferol (VITAMIN D-3 PO) Take 1 capsule by mouth daily.    [provider]  ferrous sulfate 325 (65 FE) MG tablet Take 325 mg by mouth 2 (two) times a week. Wednesday, Sunday    [provider]  Multiple Vitamin (MULTIVITAMIN) tablet Take 1 tablet by mouth daily.    [provider]  nitroGLYCERIN (NITROSTAT) 0.4 MG SL tablet Place 0.4 mg under the tongue every 5 (five) minutes as needed for chest pain (for chest pain).    [provider]  simvastatin (ZOCOR) 5 MG tablet Take 5 mg by mouth at bedtime.    [provider]    Allergies: Amoxil [amoxicillin] and Cefuroxime axetil    Review of Systems  Neurological:  Positive for headaches.  All other systems reviewed and are negative.   Updated Vital Signs BP (!) 161/57   Pulse 93   Temp 98.4 F (36.9 C) (Oral)   Resp 18   Ht 5' 2 (1.575 m)   Wt 45.4 kg   SpO2 100%   BMI 18.29 kg/m   Physical Exam Vitals and nursing note reviewed.   Gen: NAD Eyes: PERRL, EOMI, there is significant left periorbital edema noted, no proptosis is noted, the patient does appear to have some entrapment when looking upward HEENT: no oropharyngeal swelling Neck: trachea midline, no cervical spine tenderness, no stepoffs or deformities Resp: clear to auscultation bilaterally Card: RRR, no murmurs, rubs, or gallops Abd: nontender, nondistended, no seatbelt sign Extremities: no calf tenderness, no edema MSK: no thoracic spinal tenderness, no lumbar spinal tenderness, no step-offs or deformities Vascular: 2+ radial pulses bilaterally, 2+ DP pulses bilaterally Neuro: Alert and oriented x 3, equal strength sensation throughout bilateral upper and lower extremities Skin: There is a small laceration less than 1 cm just above the left eyebrow   (all labs ordered are listed, but only abnormal results are displayed) Labs Reviewed - No data to display  EKG: None  Radiology: CT Cervical Spine Wo Contrast Result  Date: 06/22/2024 EXAM: CT CERVICAL SPINE WITHOUT CONTRAST 06/22/2024 06:17:44 PM TECHNIQUE: CT of the cervical spine was performed without the administration of intravenous contrast. Multiplanar reformatted images are provided for review. Automated exposure control, iterative reconstruction, and/or weight based adjustment of the mA/kV was utilized to reduce the radiation dose to as low as reasonably achievable. COMPARISON: None available. CLINICAL HISTORY: Neck trauma (Age >= 65y). Fall. FINDINGS: BONES AND ALIGNMENT: Straightening of the normal cervical  lordosis is present. No acute fracture or traumatic malalignment. DEGENERATIVE CHANGES: No significant degenerative changes. SOFT TISSUES: Atherosclerotic changes are present at the carotid bifurcations bilaterally, right greater than left. No prevertebral soft tissue swelling. IMPRESSION: 1. No evidence of acute traumatic injury. 2. Straightening of the normal cervical lordosis. Electronically signed by: Lonni Necessary MD 06/22/2024 06:41 PM EST RP Workstation: [id number]   CT Maxillofacial Wo Contrast Result Date: 06/22/2024 EXAM: CT Face without contrast 06/22/2024 06:17:44 PM TECHNIQUE: CT of the face was performed without the administration of intravenous contrast. Multiplanar reformatted images are provided for review. Automated exposure control, iterative reconstruction, and/or weight based adjustment of the mA/kV was utilized to reduce the radiation dose to as low as reasonably achievable. COMPARISON: None available CLINICAL HISTORY: Facial trauma, blunt. Fall trauma to face. FINDINGS: AERODIGESTIVE TRACT: No mass. No edema. SALIVARY GLANDS: No acute abnormality. LYMPH NODES: SOFT TISSUES: Left periorbital and facial hematoma is present. BRAIN, ORBITS AND SINUSES: Slight left sided proptosis is present with straightening of the left optic nerve. A left orbital floor fracture is minimally displaced. Extraconal hemorrhage is present medially and inferiorly. Nondisplaced left lateral orbital wall fracture is present. Minimally displaced anterior and posterolateral maxillary sinus fractures are present. Hemorrhage is present within the left maxillary sinus. BONES: The mandible is intact and located. Fractures are present involving the left orbit and left maxillary sinus as detailed above. IMPRESSION: 1. Left periorbital and facial hematoma with slight left-sided proptosis, straightening of the left optic nerve, and extraconal hemorrhage medially and inferiorly. 2. Minimally displaced left orbital floor  fracture and nondisplaced left lateral orbital wall fracture. 3. Minimally displaced anterior and posterolateral maxillary sinus fractures with hemorrhage within the left maxillary sinus. Electronically signed by: Lonni Necessary MD 06/22/2024 06:38 PM EST RP Workstation: [id number]   CT Head Wo Contrast Result Date: 06/22/2024 EXAM: CT HEAD WITHOUT CONTRAST 06/22/2024 06:17:44 PM TECHNIQUE: CT of the head was performed without the administration of intravenous contrast. Automated exposure control, iterative reconstruction, and/or weight based adjustment of the mA/kV was utilized to reduce the radiation dose to as low as reasonably achievable. COMPARISON: 05/21/2015 CLINICAL HISTORY: Head trauma, minor (Age >= 65y). Fall. Trauma to face. FINDINGS: BRAIN AND VENTRICLES: Progressive moderate global atrophy and moderate chronic microangiopathic changes. 4 mm hyperdense focus along the septum pellucidum likely represents focal hemorrhage. No evidence of acute infarct. No hydrocephalus. No extra-axial collection. No mass effect or midline shift. ORBITS: Left lateral periorbital soft tissue contusion and left orbital fractures better evaluated on maxillofacial CT. SINUSES: Hemorrhage within left maxillary sinus. SOFT TISSUES AND SKULL: Left lateral periorbital soft tissue contusion. Left orbital fractures better evaluated on maxillofacial CT. IMPRESSION: 1. 4 mm hyperdense focus along the septum pellucidum, likely representing focal hemorrhage. 2. Left lateral periorbital soft tissue contusion and left orbital fractures. 3. Hemorrhage within the left maxillary sinus. Electronically signed by: Lonni Necessary MD 06/22/2024 06:35 PM EST RP Workstation: [id number]     Procedures   Medications Ordered in the ED  tetracaine  (PONTOCAINE) 0.5 % ophthalmic solution 1 drop (  has no administration in time range)  Tdap (ADACEL ) injection 0.5 mL (0.5 mLs Intramuscular Given 06/22/24 1626)                                     Medical Decision Making 89 year old female past medical history of TIA in the past as well as hypertension and hyperlipidemia presenting to the emergency department today with left-sided periorbital swelling after a fall earlier today.  I will further evaluate the patient here with a CT scan of her head and maxillofacial bones as well as a cervical spine CT for further evaluation for acute traumatic injuries.  The patient is not appear to have any other traumatic injuries here.  Will update her tetanus.  Suspect she likely does have an orbital fracture.  And will reevaluate for ultimate disposition.  The patient was found to have multiple sinus fractures as well as some mild proptosis and straightening of the optic nerve.  This was discussed with Dr. Octavia who came in and evaluated the patient.  I also discussed this with Dr. Tobie.  Recommends outpatient follow-up on both counts.  The patient did not have any evidence of entrapment on Dr. Milford exam.  She is discharged with return precautions.   Amount and/or Complexity of Data Reviewed Radiology: ordered.  Risk Prescription drug management.        Final diagnoses:  Closed fracture of left orbital floor, initial encounter [facility])  Maxillary sinus fracture, closed, initial encounter [facility])    ED Discharge Orders     None          Ula Prentice SAUNDERS, MD 06/22/24 2210  "

## 2024-06-22 NOTE — Discharge Instructions (Addendum)
 You do have an orbital an maxillary fracture.  Please follow up with Dr. Tobie who is our ENT doctor on-call.  He did review the images and does not think that this is going to require surgery.  Please take Tylenol  at home as needed for pain.  Follow-up with Dr. Octavia as well for reevaluation or your ophthalmologist if you have 1.  If you have an optometrist it would be better to follow-up with Dr. Octavia for reevaluation.  Dr. Octavia recommends ice for first 48 hours, then heat as needed thereafter to left eye.

## 2024-06-22 NOTE — Consult Note (Addendum)
 Ophthalmology Initial Consult Note  Sandy, Petrea [facility], 89 y.o. female Date of Service:  06/22/2024  Requesting physician: Ula Prentice SAUNDERS, MD  Information Obtained from: patient, chart Chief Complaint:  trauma OS  HPI/Discussion:  [name] is a 89 y.o. female who presents s/p mechanical fall on face. She sustained a left orbital floor and lateral wall fracture and significant bruising. She denies VA changes. She denies LOC. She reports no pain with eye movement  Past Ocular Hx:  Pseudophakia Ocular Meds:  None Family ocular history: Noncontributory  Past Medical History:  Diagnosis Date   Carotid artery occlusion    Diverticulosis Sept. 2016   GERD (gastroesophageal reflux disease)    Hyperlipidemia    Stroke (HCC) Oct.  18, 2016   Past Surgical History:  Procedure Laterality Date   CAROTID ENDARTERECTOMY Left Nov. 2005   Left carotid   EYE SURGERY  June 27,2013   Right eye- Cataract   EYE SURGERY  July 11,2013   Left eye - Cataract   TONSILLECTOMY      Prior to Admission Meds: See Epic  Inpatient Meds: See Epic  Allergies[1] Social History   Tobacco Use   Smoking status: Never   Smokeless tobacco: Never  Substance Use Topics   Alcohol use: No   Family History  Problem Relation Age of Onset   Stroke Mother 29       Multiple   Hypertension Mother    Heart disease Mother    Heart attack Mother    Cancer Father        Bladder    Heart attack Father    Heart disease Father    Hypertension Sister    Heart disease Sister        After age 26-  Carotid Artery Disease    ROS: Other than ROS in the HPI, all other systems were negative.  Exam: Temp: 98.4 F (36.9 C) Pulse Rate: 96 BP: (!) 163/67 Resp: 18 SpO2: 100 %  Visual Acuity:  near   OD 20/70   OS 20/50  No readers available.   OD OS  Confr Vis Fields Full Full  EOM (Primary) Full Mild upgaze limitation, otherwise full. No pain on upgaze  Lids/Lashes Normal 2+  upper/lower lid ecchymosis/edema  Conjunctiva White, quiet SCH temporally  Adnexa  Normal 2+ hemorrhage/edema  Pupils  3 --> 2, brisk, no rAPD 3 --> 2, brisk, no rAPD  Cornea  Clear Clear  Anterior Chamber Formed, grossly quiet Formed, grossly quiet  Lens:  IOL nicely centered IOL nicely centered  IOP 18 24  Fundus - Dilated? YES   Optic Disc  0.4, pink, no edema 0.4, pink, no edema  Post Seg:  Retina                    Vessels Normal caliber Normal caliber                  Vitreous  Clear Clear                  Macula Normal Normal                  Periphery No holes or tears No holes or tears       Neuro:  Oriented to person, place, and time:  Yes Psychiatric:  Mood and Affect Appropriate:  Yes  CT Orbits 1. Left periorbital and facial hematoma with slight left-sided proptosis, straightening of the left optic  nerve, and extraconal hemorrhage medially and inferiorly. 2. Minimally displaced left orbital floor fracture and nondisplaced left lateral orbital wall fracture. 3. Minimally displaced anterior and posterolateral maxillary sinus fractures with hemorrhage within the left maxillary sinus.    A/P:  89 y.o. female with:  1) Left orbital floor fracture, left lateral wall fracture - 2/2 mechanical fall on face - Reviewed imaging myself. No concern for entrapment. Non-operative per ENT. - No ophthalmic intervention indicated.  2) Extraconal hemorrhage, left orbit - Minimal proptosis, IOP is minimally elevated at 24. Hemorrhage should dissipate in time.  3) Left subconjunctival hemorrhage - No intervention indicated.  4) Pseudophakia both eyes - IOL in good position OU. No suggestion of dislocation given trauma.  Recommend erythromycin ointment BID OS PRN. Recommend gently cleaning lids BID with a warm cloth. Recommend ice for first 48 hours, then heat PRN thereafter to left orbit.  I would like to see the patient in my office in about 4 weeks.  JONELLE Glendia Gaudy, MD  06/22/2024, 8:54 PM     [1]  Allergies Allergen Reactions   Amoxil [Amoxicillin] Diarrhea   Cefuroxime Axetil Diarrhea

## 2024-06-22 NOTE — ED Notes (Signed)
 Patient and family given discharge, follow-up and medication instructions. Patient and family verbalized understanding. Patient left with family via private vehicle.

## 2024-07-10 ENCOUNTER — Other Ambulatory Visit: Payer: Self-pay | Admitting: Physician Assistant

## 2024-07-10 DIAGNOSIS — R4189 Other symptoms and signs involving cognitive functions and awareness: Secondary | ICD-10-CM

## 2024-07-31 ENCOUNTER — Other Ambulatory Visit
